# Patient Record
Sex: Female | Born: 1985 | Race: Black or African American | Hispanic: No | Marital: Single | Smoking: Never smoker
Health system: Southern US, Community
[De-identification: ages and names within clinical notes are randomized; demographics above are authoritative.]

## PROBLEM LIST (undated history)

## (undated) DIAGNOSIS — G43909 Migraine, unspecified, not intractable, without status migrainosus: Secondary | ICD-10-CM

## (undated) DIAGNOSIS — I10 Essential (primary) hypertension: Secondary | ICD-10-CM

---

## 2010-08-26 ENCOUNTER — Emergency Department (HOSPITAL_COMMUNITY)
Admission: EM | Admit: 2010-08-26 | Discharge: 2010-08-26 | Payer: Self-pay | Source: Home / Self Care | Admitting: Emergency Medicine

## 2010-11-26 LAB — STREP A DNA PROBE: Group A Strep Probe: NEGATIVE

## 2010-11-26 LAB — RAPID STREP SCREEN (MED CTR MEBANE ONLY): Streptococcus, Group A Screen (Direct): NEGATIVE

## 2012-04-30 ENCOUNTER — Encounter (HOSPITAL_COMMUNITY): Payer: Self-pay | Admitting: Emergency Medicine

## 2012-04-30 ENCOUNTER — Emergency Department (HOSPITAL_COMMUNITY)
Admission: EM | Admit: 2012-04-30 | Discharge: 2012-04-30 | Disposition: A | Discharge status: Home / Self Care | Payer: PRIVATE HEALTH INSURANCE | Attending: Emergency Medicine | Admitting: Emergency Medicine

## 2012-04-30 DIAGNOSIS — K089 Disorder of teeth and supporting structures, unspecified: Secondary | ICD-10-CM | POA: Insufficient documentation

## 2012-04-30 DIAGNOSIS — K0889 Other specified disorders of teeth and supporting structures: Secondary | ICD-10-CM

## 2012-04-30 DIAGNOSIS — H9201 Otalgia, right ear: Secondary | ICD-10-CM

## 2012-04-30 DIAGNOSIS — H9209 Otalgia, unspecified ear: Secondary | ICD-10-CM | POA: Insufficient documentation

## 2012-04-30 DIAGNOSIS — R51 Headache: Secondary | ICD-10-CM | POA: Insufficient documentation

## 2012-04-30 MED ORDER — OXYCODONE-ACETAMINOPHEN 5-325 MG PO TABS
1.0000 | ORAL_TABLET | Freq: Once | ORAL | Status: AC
  Administered 2012-04-30: 1 via ORAL
  Filled 2012-04-30: qty 1

## 2012-04-30 MED ORDER — OXYCODONE-ACETAMINOPHEN 5-325 MG PO TABS: ORAL_TABLET | ORAL | Status: DC

## 2012-04-30 MED ORDER — IBUPROFEN 800 MG PO TABS
800.0000 mg | ORAL_TABLET | Freq: Once | ORAL | Status: AC
  Administered 2012-04-30: 800 mg via ORAL
  Filled 2012-04-30: qty 1

## 2012-04-30 MED ORDER — PENICILLIN V POTASSIUM 250 MG PO TABS
500.0000 mg | ORAL_TABLET | Freq: Once | ORAL | Status: AC
  Administered 2012-04-30: 500 mg via ORAL
  Filled 2012-04-30: qty 2

## 2012-04-30 MED ORDER — PENICILLIN V POTASSIUM 500 MG PO TABS: 500.0000 mg | ORAL_TABLET | Freq: Four times a day (QID) | ORAL | Status: AC

## 2012-04-30 NOTE — ED Notes (Signed)
Pt c/o rt ear, jaw, and head pain since Saturday.

## 2012-04-30 NOTE — ED Notes (Signed)
R. Miller, PA at bedside  

## 2012-04-30 NOTE — ED Provider Notes (Signed)
History     CSN: [id number]  Arrival date & time 04/30/12  1729   First MD Initiated Contact with Patient 04/30/12 1749      Chief Complaint  Patient presents with  . Otalgia  . Headache  . Dental Pain    (Consider location/radiation/quality/duration/timing/severity/associated sxs/prior treatment) HPI Comments: First noted pain in R ear.  Then she noted pain in R upper 1st molar which has persisted.  She now has a generalized headache to R face and scalp.  No fever or chills.  Patient is a 26 y.o. female presenting with ear pain, headaches, and tooth pain. The history is provided by the patient. No language interpreter was used.  Otalgia This is a new problem. Episode onset: several days ago. There is pain in the right ear. The problem occurs constantly. The problem has been gradually worsening. There has been no fever. The pain is severe. Associated symptoms include headaches. Her past medical history does not include chronic ear infection, hearing loss or tympanostomy tube.  Headache  Pertinent negatives include no fever.  Dental PainThe primary symptoms include headaches. Primary symptoms do not include fever.  Additional symptoms include: ear pain.    History reviewed. No pertinent past medical history.  History reviewed. No pertinent past surgical history.  History reviewed. No pertinent family history.  History  Substance Use Topics  . Smoking status: Not on file  . Smokeless tobacco: Not on file  . Alcohol Use: No    OB History    Grav Para Term Preterm Abortions TAB SAB Ect Mult Living                  Review of Systems  Constitutional: Negative for fever and chills.  HENT: Positive for ear pain and dental problem.   Neurological: Positive for headaches.  All other systems reviewed and are negative.    Allergies  Review of patient's allergies indicates no known allergies.  Home Medications   Current Outpatient Rx  Name Route Sig Dispense Refill  .  HYDROCODONE-ACETAMINOPHEN 5-325 MG PO TABS Oral Take 2 tablets by mouth once as needed. For pain    . OXYCODONE-ACETAMINOPHEN 5-325 MG PO TABS  Take one tab po q 4-6 hrs prn pain 20 tablet 0  . PENICILLIN V POTASSIUM 500 MG PO TABS Oral Take 1 tablet (500 mg total) by mouth 4 (four) times daily. 40 tablet 0    BP 164/101  Pulse 63  Temp 98.4 F (36.9 C) (Oral)  Resp 18  Ht 5\' 1"  (1.549 m)  Wt 295 lb (133.811 kg)  BMI 55.74 kg/m2  SpO2 100%  LMP 04/27/2012  Physical Exam  Nursing note and vitals reviewed. Constitutional: She is oriented to person, place, and time. She appears well-developed and well-nourished. No distress.  HENT:  Head: Normocephalic and atraumatic. No trismus in the jaw.  Right Ear: Hearing, tympanic membrane, external ear and ear canal normal. No drainage. Tympanic membrane is not erythematous, not retracted and not bulging. No middle ear effusion. No decreased hearing is noted.  Left Ear: External ear normal.  Mouth/Throat: Uvula is midline and mucous membranes are normal. She does not have dentures. No oral lesions. Normal dentition. No dental abscesses, uvula swelling, lacerations or dental caries.    Eyes: EOM are normal.  Neck: Normal range of motion.  Cardiovascular: Normal rate, regular rhythm and normal heart sounds.   Pulmonary/Chest: Effort normal and breath sounds normal.  Abdominal: Soft. She exhibits no distension. There is no  tenderness.  Musculoskeletal: Normal range of motion.  Neurological: She is alert and oriented to person, place, and time.  Skin: Skin is warm and dry.  Psychiatric: She has a normal mood and affect. Judgment normal.    ED Course  Procedures (including critical care time)  Labs Reviewed - No data to display No results found.   1. Pain, dental   2. Earache, right   3. Headache       MDM  rx-pen VK 500 mg, 40 rx-percocet, 20 OTC ibuprofen F/u with dentist of choice.        Evalina Field, [place] 04/30/12  [phone]

## 2012-05-01 NOTE — ED Provider Notes (Signed)
Medical screening examination/treatment/procedure(s) were performed by non-physician practitioner and as supervising physician I was immediately available for consultation/collaboration.   Carleene Cooper III, MD 05/01/12 1250

## 2012-08-24 ENCOUNTER — Emergency Department (HOSPITAL_COMMUNITY): Payer: PRIVATE HEALTH INSURANCE

## 2012-08-24 ENCOUNTER — Encounter (HOSPITAL_COMMUNITY): Payer: Self-pay

## 2012-08-24 ENCOUNTER — Emergency Department (HOSPITAL_COMMUNITY)
Admission: EM | Admit: 2012-08-24 | Discharge: 2012-08-24 | Disposition: A | Discharge status: Home / Self Care | Payer: PRIVATE HEALTH INSURANCE | Attending: Emergency Medicine | Admitting: Emergency Medicine

## 2012-08-24 DIAGNOSIS — IMO0001 Reserved for inherently not codable concepts without codable children: Secondary | ICD-10-CM | POA: Insufficient documentation

## 2012-08-24 DIAGNOSIS — J40 Bronchitis, not specified as acute or chronic: Secondary | ICD-10-CM | POA: Insufficient documentation

## 2012-08-24 DIAGNOSIS — J9801 Acute bronchospasm: Secondary | ICD-10-CM | POA: Insufficient documentation

## 2012-08-24 DIAGNOSIS — J029 Acute pharyngitis, unspecified: Secondary | ICD-10-CM | POA: Insufficient documentation

## 2012-08-24 DIAGNOSIS — R6883 Chills (without fever): Secondary | ICD-10-CM | POA: Insufficient documentation

## 2012-08-24 DIAGNOSIS — R197 Diarrhea, unspecified: Secondary | ICD-10-CM | POA: Insufficient documentation

## 2012-08-24 DIAGNOSIS — J3489 Other specified disorders of nose and nasal sinuses: Secondary | ICD-10-CM | POA: Insufficient documentation

## 2012-08-24 DIAGNOSIS — J209 Acute bronchitis, unspecified: Secondary | ICD-10-CM

## 2012-08-24 LAB — RAPID STREP SCREEN (MED CTR MEBANE ONLY): Streptococcus, Group A Screen (Direct): NEGATIVE

## 2012-08-24 MED ORDER — HYDROCOD POLST-CHLORPHEN POLST 10-8 MG/5ML PO LQCR: 5.0000 mL | Freq: Two times a day (BID) | ORAL | Status: DC | PRN

## 2012-08-24 MED ORDER — ALBUTEROL SULFATE (5 MG/ML) 0.5% IN NEBU
5.0000 mg | INHALATION_SOLUTION | Freq: Once | RESPIRATORY_TRACT | Status: AC
  Administered 2012-08-24: 5 mg via RESPIRATORY_TRACT
  Filled 2012-08-24: qty 1

## 2012-08-24 MED ORDER — IPRATROPIUM BROMIDE 0.02 % IN SOLN
0.5000 mg | Freq: Once | RESPIRATORY_TRACT | Status: AC
  Administered 2012-08-24: 0.5 mg via RESPIRATORY_TRACT
  Filled 2012-08-24: qty 2.5

## 2012-08-24 MED ORDER — IBUPROFEN 800 MG PO TABS
800.0000 mg | ORAL_TABLET | Freq: Once | ORAL | Status: AC
  Administered 2012-08-24: 800 mg via ORAL
  Filled 2012-08-24: qty 1

## 2012-08-24 MED ORDER — HYDROCOD POLST-CHLORPHEN POLST 10-8 MG/5ML PO LQCR
5.0000 mL | Freq: Once | ORAL | Status: AC
  Administered 2012-08-24: 5 mL via ORAL
  Filled 2012-08-24: qty 5

## 2012-08-24 MED ORDER — ALBUTEROL SULFATE HFA 108 (90 BASE) MCG/ACT IN AERS
2.0000 | INHALATION_SPRAY | Freq: Once | RESPIRATORY_TRACT | Status: AC
  Administered 2012-08-24: 2 via RESPIRATORY_TRACT
  Filled 2012-08-24: qty 6.7

## 2012-08-24 NOTE — ED Notes (Signed)
Pt reports cough that started Sunday night, now has sore throat, diarrhea and congestion. Works in [employer] and has been exposed to sick children

## 2012-08-24 NOTE — ED Notes (Signed)
Resp therapy in to give inst on use  Of inhaler.

## 2012-08-24 NOTE — ED Notes (Signed)
Paged respiratory therapy for treatment.

## 2012-08-24 NOTE — ED Notes (Signed)
Pt c/o cough and sore throat since Sunday night.  Reports has had fever.  Temp 100.2 this morning.  PT took tylenol around 0700.

## 2012-08-26 NOTE — ED Provider Notes (Signed)
History     CSN: [id number]  Arrival date & time 08/24/12  0900   First MD Initiated Contact with Patient 08/24/12 [phone]      Chief Complaint  Patient presents with  . Cough  . Sore Throat  . Nasal Congestion  . Diarrhea    (Consider location/radiation/quality/duration/timing/severity/associated sxs/prior treatment) HPI Comments: Stacey Griffin presents with a 2 day history of flu like symptoms including non productive cough, sore throat,  Nasal congestion,  Chills and myalgias and also now has had several episodes of non bloody loose stools today. 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  She has taken tylenol for achiness and low grade fever this am,  With some improvement.  She denies chest pain, shortness of breath, nausea , vomiting and abdominal pain.  The history is provided by the patient.    History reviewed. No pertinent past medical history.  History reviewed. No pertinent past surgical history.  No family history on file.  History  Substance Use Topics  . Smoking status: Not on file  . Smokeless tobacco: Not on file  . Alcohol Use: No    OB History    Grav Para Term Preterm Abortions TAB SAB Ect Mult Living                  Review of Systems  Constitutional: Positive for chills. Negative for fever.  HENT: Positive for congestion and sore throat. Negative for neck pain.   Eyes: Negative.   Respiratory: Positive for cough. Negative for chest tightness and shortness of breath.   Cardiovascular: Negative for chest pain.  Gastrointestinal: Positive for diarrhea. Negative for nausea, vomiting, abdominal pain and abdominal distention.  Genitourinary: Negative.   Musculoskeletal: Positive for myalgias. Negative for joint swelling and arthralgias.  Skin: Negative.  Negative for rash and wound.  Neurological: Negative for dizziness, weakness, light-headedness, numbness and headaches.   Hematological: Negative.   Psychiatric/Behavioral: Negative.     Allergies  Review of patient's allergies indicates no known allergies.  Home Medications   Current Outpatient Rx  Name  Route  Sig  Dispense  Refill  . HYDROCOD POLST-CPM POLST ER 10-8 MG/5ML PO LQCR   Oral   Take 5 mLs by mouth every 12 (twelve) hours as needed.   80 mL   0     BP 132/84  Pulse 92  Temp 98.8 F (37.1 C) (Oral)  Resp 22  Ht 5\' 2"  (1.575 m)  Wt 290 lb (131.543 kg)  BMI 53.04 kg/m2  SpO2 100%  LMP 08/19/2012  Physical Exam  Nursing note and vitals reviewed. Constitutional: She appears well-developed and well-nourished.  HENT:  Head: Normocephalic and atraumatic.  Mouth/Throat: Posterior oropharyngeal erythema present. No oropharyngeal exudate, posterior oropharyngeal edema or tonsillar abscesses.  Eyes: Conjunctivae normal are normal.  Neck: Normal range of motion.  Cardiovascular: Normal rate, regular rhythm, normal heart sounds and intact distal pulses.   Pulmonary/Chest: Effort normal. No respiratory distress. She has wheezes.       Diffuse faint expiratory wheeze with fair air movement.    Abdominal: Soft. Bowel sounds are normal. There is no tenderness.  Musculoskeletal: Normal range of motion.  Neurological: She is alert.  Skin: Skin is warm and dry.  Psychiatric: She has a normal mood and affect.    ED Course  Procedures (including critical care time)   Labs Reviewed  RAPID STREP SCREEN  LAB REPORT - SCANNED  No results found.   1. Bronchitis with bronchospasm     Pt given albuterol 5/atrovent 0.5 mg neb tx with complete resolution of wheezing, normal air flow at recheck.    MDM  Patients labs and/or radiological studies were reviewed during the medical decision making and disposition process. Pt responded quickly to alb/atrovent neb given.  She was given an albuterol mdi for home use - 2 puffs q 4 prn.  Encouraged rest,  Increased fluids,  tussionex prescribed  for cough and myalgias.  Recheck prn worsened sx.  The patient appears reasonably screened and/or stabilized for discharge and I doubt any other medical condition or other [facility] requiring further screening, evaluation, or treatment in the ED at this time prior to discharge.         Burgess Amor, [place] 08/26/12 [phone]

## 2012-08-27 NOTE — ED Provider Notes (Signed)
Medical screening examination/treatment/procedure(s) were performed by non-physician practitioner and as supervising physician I was immediately available for consultation/collaboration.  Jones Skene, M.D.     Jones Skene, MD 08/27/12 2101

## 2014-09-24 ENCOUNTER — Encounter (HOSPITAL_COMMUNITY): Payer: Self-pay | Admitting: Emergency Medicine

## 2014-09-24 ENCOUNTER — Emergency Department (HOSPITAL_COMMUNITY)
Admission: EM | Admit: 2014-09-24 | Discharge: 2014-09-24 | Disposition: A | Discharge status: Home / Self Care | Payer: PRIVATE HEALTH INSURANCE | Attending: Emergency Medicine | Admitting: Emergency Medicine

## 2014-09-24 DIAGNOSIS — J029 Acute pharyngitis, unspecified: Secondary | ICD-10-CM | POA: Insufficient documentation

## 2014-09-24 DIAGNOSIS — Z7952 Long term (current) use of systemic steroids: Secondary | ICD-10-CM | POA: Insufficient documentation

## 2014-09-24 DIAGNOSIS — Z792 Long term (current) use of antibiotics: Secondary | ICD-10-CM | POA: Insufficient documentation

## 2014-09-24 DIAGNOSIS — H6501 Acute serous otitis media, right ear: Secondary | ICD-10-CM | POA: Insufficient documentation

## 2014-09-24 MED ORDER — AMOXICILLIN 500 MG PO CAPS: 500.0000 mg | ORAL_CAPSULE | Freq: Three times a day (TID) | ORAL | Status: AC

## 2014-09-24 MED ORDER — PREDNISONE 50 MG PO TABS
60.0000 mg | ORAL_TABLET | Freq: Once | ORAL | Status: AC
  Administered 2014-09-24: 60 mg via ORAL
  Filled 2014-09-24 (×2): qty 1

## 2014-09-24 MED ORDER — PREDNISONE 10 MG PO TABS: ORAL_TABLET | ORAL | Status: DC

## 2014-09-24 MED ORDER — AMOXICILLIN 250 MG PO CAPS
500.0000 mg | ORAL_CAPSULE | Freq: Once | ORAL | Status: AC
Start: 2014-09-24 — End: 2014-09-24
  Administered 2014-09-24: 500 mg via ORAL
  Filled 2014-09-24: qty 2

## 2014-09-24 NOTE — ED Provider Notes (Signed)
CSN: [id number]     Arrival date & time 09/24/14  7829 History  This chart was scribed for non-physician practitioner Vilinda Blanks, PA-C working with Donnetta Hutching, MD by Murriel Hopper, ED Scribe. This patient was seen in room APFT22/APFT22 and the patient's care was started at 7:36 PM.    Chief Complaint  Patient presents with  . Sore Throat  . Otalgia   The history is provided by the patient. No language interpreter was used.     HPI Comments: Stacey Griffin is a 29 y.o. female who presents to the Emergency Department complaining of constant right ear otalgia with associated sore throat thick nonbloody nasal congestion, and a headache that has been present for three weeks. Pt states that she began having a sore throat two nights ago. Pt states that she has had a headache for the past two weeks. Pt also notes that she has trouble swallowing, as it is painful to swallow in the left side of her throat. Pt states that she has taken Tylenol and Motrin for her symptoms with little relief. Pt denies fever, ear drainage, dizziness, decreased hearing acuity, nausea, vomiting, vertigo and neck stiffness.   History reviewed. No pertinent past medical history. History reviewed. No pertinent past surgical history. History reviewed. No pertinent family history. History  Substance Use Topics  . Smoking status: Never Smoker   . Smokeless tobacco: Not on file  . Alcohol Use: No   OB History    No data available     Review of Systems  Constitutional: Negative for fever.  HENT: Positive for congestion, ear pain, rhinorrhea, sore throat and trouble swallowing. Negative for ear discharge and facial swelling.   Respiratory: Negative for cough.   Neurological: Positive for headaches.      Allergies  Review of patient's allergies indicates no known allergies.  Home Medications   Prior to Admission medications   Medication Sig Start Date End Date Taking? Authorizing Provider  amoxicillin (AMOXIL)  500 MG capsule Take 1 capsule (500 mg total) by mouth 3 (three) times daily. 09/24/14 10/04/14  Burgess Amor, PA-C  chlorpheniramine-HYDROcodone (TUSSIONEX) 10-8 MG/5ML LQCR Take 5 mLs by mouth every 12 (twelve) hours as needed. Patient not taking: Reported on 09/24/2014 08/24/12   Burgess Amor, PA-C  predniSONE (DELTASONE) 10 MG tablet 6, 5, 4, 3, 2 then 1 tablet by mouth daily for 6 days total. 09/24/14   Burgess Amor, PA-C   BP 157/90 mmHg  Pulse 91  Temp(Src) 98.3 F (36.8 C) (Oral)  Resp 20  Ht 5' 1.5" (1.562 m)  Wt 343 lb 9.6 oz (155.856 kg)  BMI 63.88 kg/m2  SpO2 100% Physical Exam  Constitutional: She is oriented to person, place, and time. She appears well-developed and well-nourished.  HENT:  Head: Normocephalic and atraumatic.  Right Ear: Ear canal normal. Tympanic membrane is retracted. A middle ear effusion is present.  Left Ear: Tympanic membrane and ear canal normal.  Nose: Mucosal edema and rhinorrhea present.  Mouth/Throat: Uvula is midline and mucous membranes are normal. Posterior oropharyngeal edema present. No oropharyngeal exudate, posterior oropharyngeal erythema or tonsillar abscesses.  Left TM: slightly retracted, clear fluid level External canal is clear Bilateral symmetric hypertrophied tonsils  Without erythema or exudate Uvula midline   Eyes: Conjunctivae are normal.  Cardiovascular: Normal rate, regular rhythm and normal heart sounds.   Pulmonary/Chest: Effort normal and breath sounds normal. No respiratory distress. She has no wheezes. She has no rales.  Abdominal: Soft. There is no tenderness.  Musculoskeletal: Normal range of motion.  Lymphadenopathy:    She has no cervical adenopathy.  Neurological: She is alert and oriented to person, place, and time.  Skin: Skin is warm and dry. No rash noted.  Psychiatric: She has a normal mood and affect.    ED Course  Procedures (including critical care time)  DIAGNOSTIC STUDIES: Oxygen Saturation is 100% on RA,  normal by my interpretation.    COORDINATION OF CARE: 7:48 PM Discussed treatment plan with pt at bedside and pt agreed to plan.   Labs Review Labs Reviewed - No data to display  Imaging Review No results found.   EKG Interpretation None      MDM   Final diagnoses:  Right acute serous otitis media, recurrence not specified    Pt placed on amoxil and prednisone. Advised coricidin for decongestant, vicks, menthol lozenges.  Discussed elevated bp and need for close f/u.    The patient appears reasonably screened and/or stabilized for discharge and I doubt any other medical condition or other [facility] requiring further screening, evaluation, or treatment in the ED at this time prior to discharge.   *I personally performed the services described in this documentation, which was scribed in my presence. The recorded information has been reviewed and is accurate.   Burgess AmorJulie Delwyn Scoggin, PA-C 09/25/14 1240  Donnetta HutchingBrian Cook, MD 09/26/14 1723

## 2014-09-24 NOTE — ED Notes (Signed)
Patient states sore throat since last night. Right ear pain x 2 days.

## 2014-09-24 NOTE — Discharge Instructions (Signed)
Otitis Media With Effusion Otitis media with effusion is the presence of fluid in the middle ear. This is a common problem in children, which often follows ear infections. It may be present for weeks or longer after the infection. Unlike an acute ear infection, otitis media with effusion refers only to fluid behind the ear drum and not infection. Children with repeated ear and sinus infections and allergy problems are the most likely to get otitis media with effusion. CAUSES  The most frequent cause of the fluid buildup is dysfunction of the eustachian tubes. These are the tubes that drain fluid in the ears to the back of the nose (nasopharynx). SYMPTOMS   The main symptom of this condition is hearing loss. As a result, you or your child may:  Listen to the TV at a loud volume.  Not respond to questions.  Ask "what" often when spoken to.  Mistake or confuse one sound or word for another.  There may be a sensation of fullness or pressure but usually not pain. DIAGNOSIS   Your health care provider will diagnose this condition by examining you or your child's ears.  Your health care provider may test the pressure in you or your child's ear with a tympanometer.  A hearing test may be conducted if the problem persists. TREATMENT   Treatment depends on the duration and the effects of the effusion.  Antibiotics, decongestants, nose drops, and cortisone-type drugs (tablets or nasal spray) may not be helpful.  Children with persistent ear effusions may have delayed language or behavioral problems. Children at risk for developmental delays in hearing, learning, and speech may require referral to a specialist earlier than children not at risk.  You or your child's health care provider may suggest a referral to an ear, nose, and throat surgeon for treatment. The following may help restore normal hearing:  Drainage of fluid.  Placement of ear tubes (tympanostomy tubes).  Removal of adenoids  (adenoidectomy). HOME CARE INSTRUCTIONS   Avoid secondhand smoke.  Infants who are breastfed are less likely to have this condition.  Avoid feeding infants while they are lying flat.  Avoid known environmental allergens.  Avoid people who are sick. SEEK MEDICAL CARE IF:   Hearing is not better in 3 months.  Hearing is worse.  Ear pain.  Drainage from the ear.  Dizziness. MAKE SURE YOU:   Understand these instructions.  Will watch your condition.  Will get help right away if you are not doing well or get worse. Document Released: 10/09/2004 Document Revised: 01/16/2014 Document Reviewed: 03/29/2013 [facility] Patient Information 2015 [employer], [place]. This information is not intended to replace advice given to you by your health care provider. Make sure you discuss any questions you have with your health care provider.   Take your next dose of the prednisone tomorrow evening and your next dose of the antibiotic tomorrow morning.  As discussed, start taking coricidin brand for treatment of congestion.  This will not interfere with elevation in blood pressure.  You may also benefit from menthol lozenges (cough drops) .  A Vicks inhaler stick can also help relieve pressure and congestion.

## 2014-11-10 ENCOUNTER — Emergency Department (HOSPITAL_COMMUNITY)
Admission: EM | Admit: 2014-11-10 | Discharge: 2014-11-10 | Disposition: A | Discharge status: Home / Self Care | Payer: Self-pay | Attending: Emergency Medicine | Admitting: Emergency Medicine

## 2014-11-10 ENCOUNTER — Encounter (HOSPITAL_COMMUNITY): Payer: Self-pay | Admitting: *Deleted

## 2014-11-10 DIAGNOSIS — IMO0001 Reserved for inherently not codable concepts without codable children: Secondary | ICD-10-CM

## 2014-11-10 DIAGNOSIS — Z792 Long term (current) use of antibiotics: Secondary | ICD-10-CM | POA: Insufficient documentation

## 2014-11-10 DIAGNOSIS — R030 Elevated blood-pressure reading, without diagnosis of hypertension: Secondary | ICD-10-CM | POA: Insufficient documentation

## 2014-11-10 DIAGNOSIS — J029 Acute pharyngitis, unspecified: Secondary | ICD-10-CM

## 2014-11-10 DIAGNOSIS — Z79899 Other long term (current) drug therapy: Secondary | ICD-10-CM | POA: Insufficient documentation

## 2014-11-10 DIAGNOSIS — M791 Myalgia: Secondary | ICD-10-CM | POA: Insufficient documentation

## 2014-11-10 DIAGNOSIS — Z20818 Contact with and (suspected) exposure to other bacterial communicable diseases: Secondary | ICD-10-CM

## 2014-11-10 MED ORDER — AMOXICILLIN 500 MG PO CAPS: 500.0000 mg | ORAL_CAPSULE | Freq: Three times a day (TID) | ORAL | Status: AC

## 2014-11-10 MED ORDER — FLUCONAZOLE 150 MG PO TABS: 150.0000 mg | ORAL_TABLET | Freq: Every day | ORAL | Status: AC

## 2014-11-10 NOTE — Discharge Instructions (Signed)
Strep Throat °Strep throat is an infection of the throat caused by a bacteria named Streptococcus pyogenes. Your health care provider may call the infection streptococcal "tonsillitis" or "pharyngitis" depending on whether there are signs of inflammation in the tonsils or back of the throat. Strep throat is most common in children aged 29-15 years during the cold months of the year, but it can occur in people of any age during any season. This infection is spread from person to person (contagious) through coughing, sneezing, or other close contact. °SIGNS AND SYMPTOMS  °· Fever or chills. °· Painful, swollen, red tonsils or throat. °· Pain or difficulty when swallowing. °· White or yellow spots on the tonsils or throat. °· Swollen, tender lymph nodes or "glands" of the neck or under the jaw. °· Red rash all over the body (rare). °DIAGNOSIS  °Many different infections can cause the same symptoms. A test must be done to confirm the diagnosis so the right treatment can be given. A "rapid strep test" can help your health care provider make the diagnosis in a few minutes. If this test is not available, a light swab of the infected area can be used for a throat culture test. If a throat culture test is done, results are usually available in a day or two. °TREATMENT  °Strep throat is treated with antibiotic medicine. °HOME CARE INSTRUCTIONS  °· Gargle with 1 tsp of salt in 1 cup of warm water, 3-4 times per day or as needed for comfort. °· Family members who also have a sore throat or fever should be tested for strep throat and treated with antibiotics if they have the strep infection. °· Make sure everyone in your household washes their hands well. °· Do not share food, drinking cups, or personal items that could cause the infection to spread to others. °· You may need to eat a soft food diet until your sore throat gets better. °· Drink enough water and fluids to keep your urine clear or pale yellow. This will help prevent  dehydration. °· Get plenty of rest. °· Stay home from school, day care, or work until you have been on antibiotics for 24 hours. °· Take medicines only as directed by your health care provider. °· Take your antibiotic medicine as directed by your health care provider. Finish it even if you start to feel better. °SEEK MEDICAL CARE IF:  °· The glands in your neck continue to enlarge. °· You develop a rash, cough, or earache. °· You cough up green, yellow-brown, or bloody sputum. °· You have pain or discomfort not controlled by medicines. °· Your problems seem to be getting worse rather than better. °· You have a fever. °SEEK IMMEDIATE MEDICAL CARE IF:  °· You develop any new symptoms such as vomiting, severe headache, stiff or painful neck, chest pain, shortness of breath, or trouble swallowing. °· You develop severe throat pain, drooling, or changes in your voice. °· You develop swelling of the neck, or the skin on the neck becomes red and tender. °· You develop signs of dehydration, such as fatigue, dry mouth, and decreased urination. °· You become increasingly sleepy, or you cannot wake up completely. °MAKE SURE YOU: °· Understand these instructions. °· Will watch your condition. °· Will get help right away if you are not doing well or get worse. °Document Released: 08/29/2000 Document Revised: 01/16/2014 Document Reviewed: 10/31/2010 °ExitCare® Patient Information ©2015 [employer]. This information is not intended to replace advice given to you by   your health care provider. Make sure you discuss any questions you have with your health care provider.   Hypertension Hypertension, commonly called high blood pressure, is when the force of blood pumping through your arteries is too strong. Your arteries are the blood vessels that carry blood from your heart throughout your body. A blood pressure reading consists of a higher number over a lower number, such as 110/72. The higher number (systolic) is the pressure  inside your arteries when your heart pumps. The lower number (diastolic) is the pressure inside your arteries when your heart relaxes. Ideally you want your blood pressure below 120/80. Hypertension forces your heart to work harder to pump blood. Your arteries may become narrow or stiff. Having hypertension puts you at risk for heart disease, stroke, and other problems.  RISK FACTORS Some risk factors for high blood pressure are controllable. Others are not.  Risk factors you cannot control include:   Race. You may be at higher risk if you are African American.  Age. Risk increases with age.  Gender. Men are at higher risk than women before age 29 years. After age 29, women are at higher risk than men. Risk factors you can control include:  Not getting enough exercise or physical activity.  Being overweight.  Getting too much fat, sugar, calories, or salt in your diet.  Drinking too much alcohol. SIGNS AND SYMPTOMS Hypertension does not usually cause signs or symptoms. Extremely high blood pressure (hypertensive crisis) may cause headache, anxiety, shortness of breath, and nosebleed. DIAGNOSIS  To check if you have hypertension, your health care provider will measure your blood pressure while you are seated, with your arm held at the level of your heart. It should be measured at least twice using the same arm. Certain conditions can cause a difference in blood pressure between your right and left arms. A blood pressure reading that is higher than normal on one occasion does not mean that you need treatment. If one blood pressure reading is high, ask your health care provider about having it checked again. TREATMENT  Treating high blood pressure includes making lifestyle changes and possibly taking medicine. Living a healthy lifestyle can help lower high blood pressure. You may need to change some of your habits. Lifestyle changes may include:  Following the DASH diet. This diet is high in  fruits, vegetables, and whole grains. It is low in salt, red meat, and added sugars.  Getting at least 2 hours of brisk physical activity every week.  Losing weight if necessary.  Not smoking.  Limiting alcoholic beverages.  Learning ways to reduce stress. If lifestyle changes are not enough to get your blood pressure under control, your health care provider may prescribe medicine. You may need to take more than one. Work closely with your health care provider to understand the risks and benefits. HOME CARE INSTRUCTIONS  Have your blood pressure rechecked as directed by your health care provider.   Take medicines only as directed by your health care provider. Follow the directions carefully. Blood pressure medicines must be taken as prescribed. The medicine does not work as well when you skip doses. Skipping doses also puts you at risk for problems.   Do not smoke.   Monitor your blood pressure at home as directed by your health care provider. SEEK MEDICAL CARE IF:   You think you are having a reaction to medicines taken.  You have recurrent headaches or feel dizzy.  You have swelling in your ankles.  You have trouble with your vision. SEEK IMMEDIATE MEDICAL CARE IF:  You develop a severe headache or confusion.  You have unusual weakness, numbness, or feel faint.  You have severe chest or abdominal pain.  You vomit repeatedly.  You have trouble breathing. MAKE SURE YOU:   Understand these instructions.  Will watch your condition.  Will get help right away if you are not doing well or get worse. Document Released: 09/01/2005 Document Revised: 01/16/2014 Document Reviewed: 06/24/2013 [facility] Patient Information 2015 [place], [place]. This information is not intended to replace advice given to you by your health care provider. Make sure you discuss any questions you have with your health care provider.    Emergency Department Resource Guide 1) Find a Doctor  and Pay Out of Pocket Although you won't have to find out who is covered by your insurance plan, it is a good idea to ask around and get recommendations. You will then need to call the office and see if the doctor you have chosen will accept you as a new patient and what types of options they offer for patients who are self-pay. Some doctors offer discounts or will set up payment plans for their patients who do not have insurance, but you will need to ask so you aren't surprised when you get to your appointment.  2) Contact Your Local Health Department Not all health departments have doctors that can see patients for sick visits, but many do, so it is worth a call to see if yours does. If you don't know where your local health department is, you can check in your phone book. The CDC also has a tool to help you locate your state's health department, and many state websites also have listings of all of their local health departments.  3) Find a Walk-in Clinic If your illness is not likely to be very severe or complicated, you may want to try a walk in clinic. These are popping up all over the country in pharmacies, drugstores, and shopping centers. They're usually staffed by nurse practitioners or physician assistants that have been trained to treat common illnesses and complaints. They're usually fairly quick and inexpensive. However, if you have serious medical issues or chronic medical problems, these are probably not your best option.  No Primary Care Doctor: - Call Health Connect at  [phone] - they can help you locate a primary care doctor that  accepts your insurance, provides certain services, etc. - Physician Referral Service- [phone]  Chronic Pain Problems: Organization         Address  Phone   Notes  [facility]  [phone] Patients need to be referred by their primary care doctor.   Medication Assistance: Organization         Address  Phone    Notes  [facility] Medication [facility] [address] [place] [place], [place] [address] [phone] --Must be a resident of [facility] -- Must have NO insurance coverage whatsoever (no Medicaid/ Medicare, etc.) -- The pt. MUST have a primary care doctor that directs their care regularly and follows them in the community   [facility]  [phone]   [employer]  [phone]    Agencies that provide inexpensive medical care: Organization         Address  Phone   Notes  [facility]  [phone]   [facility]    [phone]   [facility]  Outpatient Clinic [address] [place], [place] [address] [phone]   [facility] [address] [place] [phone]   Planned Parenthood    [phone]   [facility]    [phone]   [facility]  [address], [place] Phone:  [phone], Fax:  [phone] Hours of Operation:  9 am - 6 pm, M-F.  Also accepts Medicaid/Medicare and self-pay.  [facility]  [address], [place] Phone: [phone], Fax: [phone]. Hours of Operation:  8:30 am - 5:30 pm, M-F.  Also accepts Medicaid and self-pay.  [facility] [place] [address] [place] Phone: [phone]   [facility] [address] Natasha Bence [place], [place] [phone], Ext. [phone] Mondays & Thursdays: 7-9 AM.  First 15 patients are seen on a first come, first serve basis.    Medicaid-accepting [facility] Providers:  Organization         Address  Phone   Notes  [facility] [address], [place] [phone] Also accepts self-pay patients.  [facility] [address] Laurell Josephs [place], [place]  [phone]   [facility] [address], [place] [phone]   [facility] [address] [place] [phone]   Renaye Rakers [address], [place]   [phone] Only accepts [place] Access [place] patients after they have their name applied to their card.   Self-Pay (no insurance) in [facility]:  Organization         Address  Phone   Notes  Sickle Cell Patients, [facility] [address] [place], [place] [phone]   [facility] [address] [place], [place] [phone]   [facility] [place]  [address], [place] [phone]   [facility]/Dr. Osei-Bonsu  [address], [place] or [address], [place] [phone] Phone number for both [place] and [place] locations is the same.  [facility] [address] [place] [phone]   [facility] [address] [place] or [address] [phone]   [facility] [address] [place] [phone], phone; [phone], fax Sees patients 1st and 3rd Saturday of every month.  Must not qualify for public or private insurance (i.e. Medicaid, Medicare, [place] Health Choice, Veterans' Benefits)  Household income should be no more than 200% of the poverty level The clinic cannot treat you if you are pregnant or think you are pregnant  Sexually transmitted diseases are not treated at the clinic.    Dental Care: Organization         Address  Phone  Notes  [facility] Department of [facility] [address] [place], [place] [phone] Accepts children up to age 58 who are enrolled in [place] or [place] Health Choice; pregnant women with a Medicaid card; and children who have applied for Medicaid or [place] Health Choice, but were declined, whose parents can pay a reduced fee at time of service.  [facility] Department of [facility]  [address], [place] [phone] Accepts children up to age 61 who are enrolled  in Medicaid or [place] Health Choice; pregnant women with a Medicaid card; and children who have applied for Medicaid or  Health Choice, but were declined, whose parents can pay a reduced fee at time of service.  [facility]  [address] [place], [place] [phone] Patients are seen by appointment only. Walk-ins are not accepted. [facility] will see patients 29 years of age and older. Monday - Tuesday (8am-5pm) Most Wednesdays (8:30-5pm) $30 per visit, cash only  [facility]  [address], [facility] [phone] Patients are seen by appointment only. Walk-ins are not accepted. [facility] will see patients 53 years of age and older. One Wednesday Evening (Monthly: Volunteer Based).  $30 per visit, cash only  [employer]  [phone] for adults; Children under age 30, call Graduate Pediatric Dentistry at [phone]. Children aged 22-14, please call [phone] to request a pediatric application.  Dental services are provided in all areas of dental care including fillings, crowns and bridges, complete and partial dentures, implants, gum treatment, root canals, and extractions. Preventive care is also provided. Treatment is provided to both adults and children. Patients are selected via a lottery and there is often a waiting list.   [facility] [address] [place]  [phone] [email or web address]   [facility] [address] [place], [place] [phone], Ext. [phone] Second and Fourth Thursday of each month, opens at 6:30 AM; Clinic ends at 9 AM.  Patients are seen on a first-come first-served basis, and a limited number are seen during each clinic.   [facility]  [address] Ether Griffins [place], [place] [phone]   Eligibility Requirements You must have lived in  [place], [place], or [place] counties for at least the last three months.   You cannot be eligible for state or federal sponsored [place], including [employer], [place], or [employer].   You generally cannot be eligible for healthcare insurance through your employer.    How to apply: Eligibility screenings are held every Tuesday and Wednesday afternoon from 1:00 pm until 4:00 pm. You do not need an appointment for the interview!  [facility] [address] [place], [place] [phone]   [facility] Health Department  [phone]   [facility] Health Department  [phone]   [facility] Health Department  [phone]    Behavioral Health Resources in the Community: Intensive Outpatient Programs Organization         Address  Phone  Notes  [facility] Services [address] [place], [place] [phone]   [facility] Outpatient [address] [place], [place] [phone]   [facility] [address], [place], [place]  [phone]   [facility] [address]  [place], [place] [phone] or [phone]   Substance Abuse Resources Organization         Address  Phone  Notes  [facility]  [phone]   [facility]  [phone]   [facility]  [phone]   Floydene Flock  [phone]   Residential & Outpatient Substance Abuse Program  [phone]   Psychological Services Organization         Address  Phone  Notes  [facility]  [phone]   [facility] Services  [phone]   [facility] Mental Health [address], [place] [phone] or  [phone]    Mobile Crisis Teams Organization         Address  Phone  Notes  [facility], Mobile Crisis Care Unit  [phone]   [facility]  [address] [place], [place] [phone]   [facility] [place] [facility] [address] [place] [phone]    Self-Help/Support Groups Organization         Address  Phone             Notes  [facility] [place] - variety of support groups  336- I7437963 Call for more information  Narcotics Anonymous (NA), [facility] [address], [employer] [place]  2 meetings at this location   Statistician         Address  Phone  Notes  [facility] [address],    [place]  [phone]   [facility]  [address], [place] [address], [place], [place] [phone]   [facility] [address] [place], [place] [phone] Admissions: 8am-3pm M-F  [facility] [address],    [place], [place] [phone]   [facility] [address] [place], [place], [place] [phone]   [facility] [address],  [place], [place] [phone]   [facility] - Intensive Outpatient [address] Laurell Josephs [address], [place], [place] [phone]   [facility] (Addiction [facility] [address] [place].,  [place], [place] [phone] or [phone]   [facility] ([facility]) [address], [place], [place] [phone] Accepts Medicaid  [facility] [address] [place] [address]  [place] [phone] Substance Abuse/Addiction Treatment   [facility] Organization         Address  Phone  Notes  [facility]  [phone]   Angie Fava, PhD [address] Ervin Knack [place], [place]   [phone] or [phone]   [facility]   [address] [place] [address] [place], [place] [phone]   [facility] [address] [place], [place] [phone] Insurance/Medicaid/sponsorship through [facility] [address]                                    [place], [place] [phone] Therapy/tele-psych/case  [facility] [address], [place] [phone]    Dr. Lolly Mustache  [phone]   [facility]  [employer] [facility] Dept. 1) [address] [place] 2) [address], [place] 3)  [address], [place] [phone]  [phone]   [facility] Child Abuse Hotline 304-042-9059 or [phone] (After Hours)      Use the medicine as prescribed until gone.  Rest and make sure you are drinking plenty of fluids.  See the resource guide for locating a primary doctor.  Your blood pressure is elevated today and you should have this rechecked within the week.

## 2014-11-10 NOTE — ED Notes (Addendum)
Sore throat, fever, chills, body aches  Has been exposed to strep

## 2014-11-10 NOTE — ED Notes (Signed)
Pt seen and released from triage,Exam by PA

## 2014-11-11 NOTE — ED Provider Notes (Signed)
CSN: [id number]     Arrival date & time 11/10/14  1906 History   First MD Initiated Contact with Patient 11/10/14 2016     Chief Complaint  Patient presents with  . Sore Throat     (Consider location/radiation/quality/duration/timing/severity/associated sxs/prior Treatment) Patient is a 29 y.o. female presenting with pharyngitis. The history is provided by the patient.  Sore Throat This is a new (she reports being exposed to strep throat this week) problem. The current episode started yesterday. The problem occurs constantly. The problem has been gradually worsening. Associated symptoms include chills, fatigue, a fever, myalgias, a sore throat and swollen glands. Pertinent negatives include no abdominal pain, arthralgias, chest pain, congestion, coughing, nausea, neck pain, rash or vomiting. The symptoms are aggravated by swallowing. She has tried acetaminophen for the symptoms. The treatment provided no relief.    History reviewed. No pertinent past medical history. History reviewed. No pertinent past surgical history. History reviewed. No pertinent family history. History  Substance Use Topics  . Smoking status: Never Smoker   . Smokeless tobacco: Not on file  . Alcohol Use: No   OB History    No data available     Review of Systems  Constitutional: Positive for fever, chills and fatigue.  HENT: Positive for sore throat. Negative for congestion, rhinorrhea, sinus pressure, trouble swallowing and voice change.   Respiratory: Negative for cough and shortness of breath.   Cardiovascular: Negative for chest pain.  Gastrointestinal: Negative for nausea, vomiting and abdominal pain.  Musculoskeletal: Positive for myalgias. Negative for arthralgias and neck pain.  Skin: Negative for rash.      Allergies  Review of patient's allergies indicates no known allergies.  Home Medications   Prior to Admission medications   Medication Sig Start Date End Date Taking? Authorizing  Provider  amoxicillin (AMOXIL) 500 MG capsule Take 1 capsule (500 mg total) by mouth 3 (three) times daily. 11/10/14 11/20/14  Burgess AmorJulie Andrika Peraza, PA-C  chlorpheniramine-HYDROcodone (TUSSIONEX) 10-8 MG/5ML LQCR Take 5 mLs by mouth every 12 (twelve) hours as needed. Patient not taking: Reported on 09/24/2014 08/24/12   Burgess AmorJulie Darling Cieslewicz, PA-C  fluconazole (DIFLUCAN) 150 MG tablet Take 1 tablet (150 mg total) by mouth daily. 11/10/14 11/17/14  Burgess AmorJulie Beckey Polkowski, PA-C  predniSONE (DELTASONE) 10 MG tablet 6, 5, 4, 3, 2 then 1 tablet by mouth daily for 6 days total. 09/24/14   Burgess AmorJulie Meliyah Simon, PA-C   BP 146/89 mmHg  Pulse 92  Temp(Src) 98.5 F (36.9 C) (Oral)  Resp 20  Ht 5\' 2"  (1.575 m)  Wt 340 lb (154.223 kg)  BMI 62.17 kg/m2  SpO2 100% Physical Exam  Constitutional: She is oriented to person, place, and time. She appears well-developed and well-nourished.  HENT:  Head: Normocephalic and atraumatic.  Right Ear: Tympanic membrane and ear canal normal.  Left Ear: Tympanic membrane and ear canal normal.  Nose: No mucosal edema or rhinorrhea.  Mouth/Throat: Uvula is midline and mucous membranes are normal. Oropharyngeal exudate and posterior oropharyngeal erythema present. No posterior oropharyngeal edema or tonsillar abscesses.  Eyes: Conjunctivae are normal.  Cardiovascular: Normal rate and normal heart sounds.   Pulmonary/Chest: Effort normal. No respiratory distress. She has no wheezes. She has no rales.  Musculoskeletal: Normal range of motion.  Neurological: She is alert and oriented to person, place, and time.  Skin: Skin is warm and dry. No rash noted.  Psychiatric: She has a normal mood and affect.    ED Course  Procedures (including critical care time) Labs Review  Labs Reviewed - No data to display  Imaging Review No results found.   EKG Interpretation None      MDM   Final diagnoses:  Pharyngitis  Strep throat exposure  Elevated blood pressure    Pt with positive strep exposure and exam c/w  strep pharyngitis.  Amoxil, diflucan at patient request as abx gives yeast infections.  Advised increased fluid intake, rest, tylenol.  Advised recheck bp once feeling better given elevation tonight.  At repeat, it was improved but still elevated.    Burgess Amor, PA-C 11/11/14 1155  Donnetta Hutching, MD 11/11/14 1800

## 2016-01-22 ENCOUNTER — Emergency Department (HOSPITAL_COMMUNITY)
Admission: EM | Admit: 2016-01-22 | Discharge: 2016-01-22 | Disposition: A | Discharge status: Home / Self Care | Payer: Managed Care, Other (non HMO) | Attending: Emergency Medicine | Admitting: Emergency Medicine

## 2016-01-22 ENCOUNTER — Encounter (HOSPITAL_COMMUNITY): Payer: Self-pay | Admitting: Emergency Medicine

## 2016-01-22 DIAGNOSIS — B349 Viral infection, unspecified: Secondary | ICD-10-CM | POA: Diagnosis present

## 2016-01-22 DIAGNOSIS — R197 Diarrhea, unspecified: Secondary | ICD-10-CM | POA: Insufficient documentation

## 2016-01-22 HISTORY — DX: Migraine, unspecified, not intractable, without status migrainosus: G43.909

## 2016-01-22 LAB — URINALYSIS, ROUTINE W REFLEX MICROSCOPIC
BILIRUBIN URINE: NEGATIVE
Glucose, UA: NEGATIVE mg/dL
Ketones, ur: NEGATIVE mg/dL
Leukocytes, UA: NEGATIVE
NITRITE: NEGATIVE
PROTEIN: NEGATIVE mg/dL
SPECIFIC GRAVITY, URINE: 1.015 (ref 1.005–1.030)
pH: 6.5 (ref 5.0–8.0)

## 2016-01-22 LAB — URINE MICROSCOPIC-ADD ON

## 2016-01-22 LAB — POC URINE PREG, ED: Preg Test, Ur: NEGATIVE

## 2016-01-22 MED ORDER — IBUPROFEN 800 MG PO TABS
800.0000 mg | ORAL_TABLET | Freq: Once | ORAL | Status: AC
  Administered 2016-01-22: 800 mg via ORAL
  Filled 2016-01-22: qty 1

## 2016-01-22 MED ORDER — ACETAMINOPHEN 325 MG PO TABS
650.0000 mg | ORAL_TABLET | Freq: Once | ORAL | Status: AC | PRN
  Administered 2016-01-22: 650 mg via ORAL
  Filled 2016-01-22: qty 2

## 2016-01-22 MED ORDER — HYDROCODONE-ACETAMINOPHEN 5-325 MG PO TABS: 1.0000 | ORAL_TABLET | ORAL | Status: AC | PRN

## 2016-01-22 MED ORDER — ONDANSETRON 4 MG PO TBDP
4.0000 mg | ORAL_TABLET | Freq: Once | ORAL | Status: AC
Start: 2016-01-22 — End: 2016-01-22
  Administered 2016-01-22: 4 mg via ORAL
  Filled 2016-01-22: qty 1

## 2016-01-22 MED ORDER — HYDROCODONE-ACETAMINOPHEN 5-325 MG PO TABS
2.0000 | ORAL_TABLET | Freq: Once | ORAL | Status: AC
  Administered 2016-01-22: 2 via ORAL
  Filled 2016-01-22: qty 2

## 2016-01-22 MED ORDER — ONDANSETRON HCL 4 MG PO TABS: 4.0000 mg | ORAL_TABLET | Freq: Four times a day (QID) | ORAL | Status: AC

## 2016-01-22 NOTE — ED Notes (Addendum)
Patient complaining of headache and nausea since yesterday. States "I've had migraines in the past but it's been a long time." Patient also has temperature of 100.4 in triage. Protocol orders for tylenol to be given in triage.

## 2016-01-22 NOTE — Discharge Instructions (Signed)
Your examination favors a viral illness. Please use ibuprofen 600 mg every 6 hours as needed for aching or fever. Use Zofran every 6 hours for nausea, use Tylenol or ibuprofen for headache or mild pain, may use Norco for more severe pain. Norco may cause drowsiness, please do not drive, do not drink alcohol, do not operate machinery, or participate in activities requiring concentration while taking this medication. Viral Infections A virus is a type of germ. Viruses can cause:  Minor sore throats.  Aches and pains.  Headaches.  Runny nose.  Rashes.  Watery eyes.  Tiredness.  Coughs.  Loss of appetite.  Feeling sick to your stomach (nausea).  Throwing up (vomiting).  Watery poop (diarrhea). HOME CARE   Only take medicines as told by your doctor.  Drink enough water and fluids to keep your pee (urine) clear or pale yellow. Sports drinks are a good choice.  Get plenty of rest and eat healthy. Soups and broths with crackers or rice are fine. GET HELP RIGHT AWAY IF:   You have a very bad headache.  You have shortness of breath.  You have chest pain or neck pain.  You have an unusual rash.  You cannot stop throwing up.  You have watery poop that does not stop.  You cannot keep fluids down.  You or your child has a temperature by mouth above 102 F (38.9 C), not controlled by medicine.  Your baby is older than 3 months with a rectal temperature of 102 F (38.9 C) or higher.  Your baby is 43 months old or younger with a rectal temperature of 100.4 F (38 C) or higher. MAKE SURE YOU:   Understand these instructions.  Will watch this condition.  Will get help right away if you are not doing well or get worse.   This information is not intended to replace advice given to you by your health care provider. Make sure you discuss any questions you have with your health care provider.   Document Released: 08/14/2008 Document Revised: 11/24/2011 Document Reviewed:  02/07/2015 Elsevier Interactive Patient Education [employer]2016 [employer] Inc[employer]

## 2016-01-22 NOTE — ED Provider Notes (Signed)
CSN: [id number]     Arrival date & time 01/22/16  1857 History   First MD Initiated Contact with Patient 01/22/16 1924     Chief Complaint  Patient presents with  . Headache  . Nausea     (Consider location/radiation/quality/duration/timing/severity/associated sxs/prior Treatment) HPI Comments: Patient is a 30 year old female who presents to the emergency department with a complaint of nausea and headaches.  The patient states that on yesterday May 8 she began to have problems with nausea, this shortly accompanied by headache. She says that the headache was light at that time, but today the headache has become much more severe. She also has symptoms of body aches, nausea, low back pain, and diarrhea. 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  The history is provided by the patient.    Past Medical History  Diagnosis Date  . Migraines    History reviewed. No pertinent past surgical history. History reviewed. No pertinent family history. Social History  Substance Use Topics  . Smoking status: Never Smoker   . Smokeless tobacco: None  . Alcohol Use: No   OB History    No data available     Review of Systems  Constitutional: Positive for fever, chills, activity change and appetite change.  HENT: Positive for congestion. Negative for sore throat.   Gastrointestinal: Positive for nausea and diarrhea. Negative for vomiting.  Musculoskeletal: Positive for myalgias and back pain.  Skin: Negative for rash.  All other systems reviewed and are negative.     Allergies  Review of patient's allergies indicates no known allergies.  Home Medications   Prior to Admission medications   Medication Sig Start Date End Date Taking? Authorizing Provider  chlorpheniramine-HYDROcodone (TUSSIONEX) 10-8 MG/5ML LQCR Take 5 mLs by mouth every 12 (twelve) hours as needed. Patient not taking: Reported  on 09/24/2014 08/24/12   Burgess AmorJulie Idol, PA-C  predniSONE (DELTASONE) 10 MG tablet 6, 5, 4, 3, 2 then 1 tablet by mouth daily for 6 days total. 09/24/14   Burgess AmorJulie Idol, PA-C   BP 165/94 mmHg  Pulse 103  Temp(Src) 100.4 F (38 C) (Oral)  Resp 22  Ht 5\' 1"  (1.549 m)  Wt 154.223 kg  BMI 64.28 kg/m2  SpO2 100% Physical Exam  Constitutional: She is oriented to person, place, and time. She appears well-developed and well-nourished.  Non-toxic appearance.  HENT:  Head: Normocephalic.  Right Ear: Tympanic membrane and external ear normal.  Left Ear: Tympanic membrane and external ear normal.  There is some enlargement of the right tonsil. Mild increased redness of the posterior pharynx. The uvula is in the midline. Speech is clear and understandable.  Eyes: EOM and lids are normal. Pupils are equal, round, and reactive to light.  Neck: Normal range of motion. Neck supple. Carotid bruit is not present.  Cardiovascular: Normal rate, regular rhythm, normal heart sounds, intact distal pulses and normal pulses.   Pulmonary/Chest: Breath sounds normal. No respiratory distress.  Abdominal: Soft. Bowel sounds are normal. There is no tenderness. There is no guarding.  Musculoskeletal: Normal range of motion.  No hot joints noted.  Lymphadenopathy:       Head (right side): No submandibular adenopathy present.       Head (left side): No submandibular adenopathy present.    She has no cervical adenopathy.  Neurological: She is alert and oriented to person, place, and time. She has normal strength. No  cranial nerve deficit or sensory deficit.  Skin: Skin is warm and dry.  Psychiatric: She has a normal mood and affect. Her speech is normal.  Nursing note and vitals reviewed.   ED Course  Procedures (including critical care time) Labs Review Labs Reviewed  URINALYSIS, ROUTINE W REFLEX MICROSCOPIC (NOT AT [facility])  POC URINE PREG, ED    Imaging Review No results found. I have personally reviewed and  evaluated these images and lab results as part of my medical decision-making.   EKG Interpretation None      MDM  Temperature increased after oral Tylenol from 100.42 101.3. The patient was treated with ibuprofen 800 mg. The patient continues to have problems with headache. Patient treated with Norco.  Urine pregnancy test is negative.  Urinalysis is negative.  After the ibuprofen, the temperature came down to 98.7. The patient states her headache feels much better after the Norco. The patient will be discharged home. We discussed the need for good handwashing, using her mask until symptoms have resolved, we also is cussed using Tylenol or ibuprofen for fever and aching. Prescription for Norco and Zofran given to the patient. The patient will follow-up with her primary physician, or return to the emergency department if any changes or problems. Note excusing the patient from a work until Saturday, May 13 was given to the patient.    Final diagnoses:  None    *I have reviewed nursing notes, vital signs, and all appropriate lab and imaging results for this patient.**    Ivery Quale, PA-C 01/22/16 2130  Raeford Razor, MD 01/24/16 1308

## 2016-01-24 ENCOUNTER — Emergency Department (HOSPITAL_COMMUNITY)
Admission: EM | Admit: 2016-01-24 | Discharge: 2016-01-24 | Disposition: A | Discharge status: Home / Self Care | Payer: Managed Care, Other (non HMO) | Attending: Emergency Medicine | Admitting: Emergency Medicine

## 2016-01-24 ENCOUNTER — Emergency Department (HOSPITAL_COMMUNITY): Payer: Managed Care, Other (non HMO)

## 2016-01-24 ENCOUNTER — Encounter (HOSPITAL_COMMUNITY): Payer: Self-pay | Admitting: Emergency Medicine

## 2016-01-24 DIAGNOSIS — Z791 Long term (current) use of non-steroidal anti-inflammatories (NSAID): Secondary | ICD-10-CM | POA: Diagnosis not present

## 2016-01-24 DIAGNOSIS — M545 Low back pain: Secondary | ICD-10-CM | POA: Diagnosis present

## 2016-01-24 DIAGNOSIS — R51 Headache: Secondary | ICD-10-CM | POA: Diagnosis not present

## 2016-01-24 DIAGNOSIS — Z79899 Other long term (current) drug therapy: Secondary | ICD-10-CM | POA: Insufficient documentation

## 2016-01-24 DIAGNOSIS — R109 Unspecified abdominal pain: Secondary | ICD-10-CM | POA: Diagnosis not present

## 2016-01-24 DIAGNOSIS — R112 Nausea with vomiting, unspecified: Secondary | ICD-10-CM | POA: Diagnosis not present

## 2016-01-24 DIAGNOSIS — N12 Tubulo-interstitial nephritis, not specified as acute or chronic: Secondary | ICD-10-CM | POA: Diagnosis not present

## 2016-01-24 LAB — COMPREHENSIVE METABOLIC PANEL
ALK PHOS: 67 U/L (ref 38–126)
ALT: 13 U/L — ABNORMAL LOW (ref 14–54)
ANION GAP: 9 (ref 5–15)
AST: 15 U/L (ref 15–41)
Albumin: 3.2 g/dL — ABNORMAL LOW (ref 3.5–5.0)
BUN: 11 mg/dL (ref 6–20)
CHLORIDE: 100 mmol/L — AB (ref 101–111)
CO2: 27 mmol/L (ref 22–32)
Calcium: 8.8 mg/dL — ABNORMAL LOW (ref 8.9–10.3)
Creatinine, Ser: 1.04 mg/dL — ABNORMAL HIGH (ref 0.44–1.00)
GFR calc non Af Amer: >60 mL/min (ref >60)
Glucose, Bld: 103 mg/dL — ABNORMAL HIGH (ref 65–99)
POTASSIUM: 3.1 mmol/L — AB (ref 3.5–5.1)
SODIUM: 136 mmol/L (ref 135–145)
Total Bilirubin: 0.4 mg/dL (ref 0.3–1.2)
Total Protein: 7.7 g/dL (ref 6.5–8.1)

## 2016-01-24 LAB — CBC WITH DIFFERENTIAL/PLATELET
BASOS PCT: 0 %
Basophils Absolute: 0 10*3/uL (ref 0.0–0.1)
EOS ABS: 0 10*3/uL (ref 0.0–0.7)
EOS PCT: 0 %
HCT: 35.1 % — ABNORMAL LOW (ref 36.0–46.0)
HEMOGLOBIN: 11.1 g/dL — AB (ref 12.0–15.0)
Lymphocytes Relative: 11 %
Lymphs Abs: 1.3 10*3/uL (ref 0.7–4.0)
MCH: 23.6 pg — ABNORMAL LOW (ref 26.0–34.0)
MCHC: 31.6 g/dL (ref 30.0–36.0)
MCV: 74.7 fL — ABNORMAL LOW (ref 78.0–100.0)
MONOS PCT: 9 %
Monocytes Absolute: 1 10*3/uL (ref 0.1–1.0)
NEUTROS PCT: 81 %
Neutro Abs: 9.6 10*3/uL — ABNORMAL HIGH (ref 1.7–7.7)
PLATELETS: 286 10*3/uL (ref 150–400)
RBC: 4.7 MIL/uL (ref 3.87–5.11)
RDW: 14.2 % (ref 11.5–15.5)
WBC: 11.9 10*3/uL — ABNORMAL HIGH (ref 4.0–10.5)

## 2016-01-24 LAB — LIPASE, BLOOD: Lipase: 15 U/L (ref 11–51)

## 2016-01-24 LAB — URINALYSIS, ROUTINE W REFLEX MICROSCOPIC
Glucose, UA: NEGATIVE mg/dL
Ketones, ur: 15 mg/dL — AB
Leukocytes, UA: NEGATIVE
NITRITE: NEGATIVE
PH: 6 (ref 5.0–8.0)
Protein, ur: >300 mg/dL — AB
Specific Gravity, Urine: 1.025 (ref 1.005–1.030)

## 2016-01-24 LAB — URINE MICROSCOPIC-ADD ON

## 2016-01-24 MED ORDER — PROMETHAZINE HCL 25 MG PO TABS: 25.0000 mg | ORAL_TABLET | Freq: Four times a day (QID) | ORAL | Status: AC | PRN

## 2016-01-24 MED ORDER — FENTANYL CITRATE (PF) 100 MCG/2ML IJ SOLN
50.0000 ug | Freq: Once | INTRAMUSCULAR | Status: AC
  Administered 2016-01-24: 50 ug via INTRAVENOUS
  Filled 2016-01-24: qty 2

## 2016-01-24 MED ORDER — SODIUM CHLORIDE 0.9 % IV SOLN
INTRAVENOUS | Status: DC
  Administered 2016-01-24: 13:00:00 via INTRAVENOUS

## 2016-01-24 MED ORDER — IOPAMIDOL (ISOVUE-300) INJECTION 61%
150.0000 mL | Freq: Once | INTRAVENOUS | Status: AC | PRN
  Administered 2016-01-24: 125 mL via INTRAVENOUS

## 2016-01-24 MED ORDER — HYDROCODONE-ACETAMINOPHEN 5-325 MG PO TABS: 1.0000 | ORAL_TABLET | Freq: Four times a day (QID) | ORAL | Status: AC | PRN

## 2016-01-24 MED ORDER — SODIUM CHLORIDE 0.9 % IV BOLUS (SEPSIS)
1000.0000 mL | Freq: Once | INTRAVENOUS | Status: AC
  Administered 2016-01-24: 1000 mL via INTRAVENOUS

## 2016-01-24 MED ORDER — DIATRIZOATE MEGLUMINE & SODIUM 66-10 % PO SOLN
ORAL | Status: AC
  Administered 2016-01-24: 15 mL
  Filled 2016-01-24: qty 30

## 2016-01-24 MED ORDER — SODIUM CHLORIDE 0.9 % IV BOLUS (SEPSIS): 250.0000 mL | Freq: Once | INTRAVENOUS | Status: DC

## 2016-01-24 MED ORDER — SODIUM CHLORIDE 0.9 % IV SOLN
INTRAVENOUS | Status: DC
Start: 2016-01-25 — End: 2016-01-24

## 2016-01-24 MED ORDER — CEPHALEXIN 500 MG PO CAPS: 500.0000 mg | ORAL_CAPSULE | Freq: Four times a day (QID) | ORAL | Status: AC

## 2016-01-24 MED ORDER — ONDANSETRON HCL 4 MG/2ML IJ SOLN
4.0000 mg | Freq: Once | INTRAMUSCULAR | Status: AC
  Administered 2016-01-24: 4 mg via INTRAVENOUS
  Filled 2016-01-24: qty 2

## 2016-01-24 MED ORDER — DEXTROSE 5 % IV SOLN
1.0000 g | Freq: Once | INTRAVENOUS | Status: AC
  Administered 2016-01-24: 1 g via INTRAVENOUS
  Filled 2016-01-24: qty 10

## 2016-01-24 NOTE — Discharge Instructions (Signed)
Pyelonephritis, Adult Pyelonephritis is a kidney infection. The kidneys are organs that help clean your blood by moving waste out of your blood and into your pee (urine). This infection can happen quickly, or it can last for a long time. In most cases, it clears up with treatment and does not cause other problems. HOME CARE Medicines  Take over-the-counter and prescription medicines only as told by your doctor.  Take your antibiotic medicine as told by your doctor. Do not stop taking the medicine even if you start to feel better. General Instructions  Drink enough fluid to keep your pee clear or pale yellow.  Avoid caffeine, tea, and carbonated drinks.  Pee (urinate) often. Avoid holding in pee for long periods of time.  Pee before and after sex.  After pooping (having a bowel movement), women should wipe from front to back. Use each tissue only once.  Keep all follow-up visits as told by your doctor. This is important. GET HELP IF:  You do not feel better after 2 days.  Your symptoms get worse.  You have a fever. GET HELP RIGHT AWAY IF:  You cannot take your medicine or drink fluids as told.  You have chills and shaking.  You throw up (vomit).  You have very bad pain in your side (flank) or back.  You feel very weak or you pass out (faint).   This information is not intended to replace advice given to you by your health care provider. Make sure you discuss any questions you have with your health care provider.   Take antibiotics as directed. Would expect some improvement over the next 2-3 days. Return if not better return for any new or worse symptoms. Take pain medicine as needed. Take the antinausea medicine as needed.   Document Released: 10/09/2004 Document Revised: 05/23/2015 Document Reviewed: 12/25/2014 Elsevier Interactive Patient Education [employer]2016 [employer]

## 2016-01-24 NOTE — ED Provider Notes (Signed)
CSN: [id number]     Arrival date & time 01/24/16  0825 History  By signing my name below, I, Ronney Lion, attest that this documentation has been prepared under the direction and in the presence of Vanetta Mulders, MD. Electronically Signed: Ronney Lion, ED Scribe. 01/24/2016. 10:16 AM.   Chief Complaint  Patient presents with  . Headache   Patient is a 30 y.o. female presenting with back pain. The history is provided by the patient and medical records. No language interpreter was used.  Back Pain Pain location: low back. Radiates to: RUQ and right flank. Pain severity:  Severe Duration:  2 days Timing:  Constant Relieved by:  None tried Worsened by:  Nothing tried Ineffective treatments:  None tried Associated symptoms: abdominal pain, fever and headaches   Associated symptoms: no chest pain and no dysuria     HPI Comments: Stacey Griffin is a 30 y.o. female who presents to the Emergency Department with multiple complaints, including constant, 8/10, right-sided low back pain radiating to her right flank and RUQ, that began 2 days ago. She also complains of associated room-spinning dizziness, headache, nausea, 2 episodes of vomiting, fever, and chills. Per medical records, patient was seen at the ED here 2 days ago for similar symptoms. She had a UA and UPT that were negative. She denies a history of kidney stones. She denies cough, dysuria, diarrhea, visual changes, cough, rhinorrhea, sore throat, chest pain, SOB, hematuria, leg swelling, rash, or bleeding easily.   Past Medical History  Diagnosis Date  . Migraines    History reviewed. No pertinent past surgical history. History reviewed. No pertinent family history. Social History  Substance Use Topics  . Smoking status: Never Smoker   . Smokeless tobacco: None  . Alcohol Use: No   OB History    No data available     Review of Systems  Constitutional: Positive for fever and chills.  HENT: Negative for rhinorrhea and sore  throat.   Eyes: Negative for visual disturbance.  Respiratory: Negative for cough and shortness of breath.   Cardiovascular: Negative for chest pain and leg swelling.  Gastrointestinal: Positive for nausea, vomiting and abdominal pain. Negative for diarrhea.  Genitourinary: Negative for dysuria.  Musculoskeletal: Positive for back pain.  Skin: Negative for rash.  Neurological: Positive for dizziness and headaches.  Hematological: Does not bruise/bleed easily.      Allergies  Review of patient's allergies indicates no known allergies.  Home Medications   Prior to Admission medications   Medication Sig Start Date End Date Taking? Authorizing Provider  acetaminophen (TYLENOL) 325 MG tablet Take 650 mg by mouth as needed for mild pain.   Yes Historical Provider, MD  estradiol cypionate (DEPO-ESTRADIOL) 5 MG/ML injection Inject into the muscle every 3 (three) months.   Yes Historical Provider, MD  HYDROcodone-acetaminophen (NORCO/VICODIN) 5-325 MG tablet Take 1 tablet by mouth every 4 (four) hours as needed. 01/22/16  Yes Ivery Quale, PA-C  ibuprofen (ADVIL,MOTRIN) 200 MG tablet Take 800 mg by mouth once.   Yes Historical Provider, MD  nitrofurantoin (MACRODANTIN) 100 MG capsule 100 mg 2 (two) times daily.  11/15/15  Yes Historical Provider, MD  nystatin (MYCOSTATIN) powder Apply topically 2 (two) times daily.  11/13/15  Yes Historical Provider, MD  ondansetron (ZOFRAN) 4 MG tablet Take 1 tablet (4 mg total) by mouth every 6 (six) hours. 01/22/16  Yes Ivery Quale, PA-C  cephALEXin (KEFLEX) 500 MG capsule Take 1 capsule (500 mg total) by mouth 4 (four) times  daily. 01/24/16   Vanetta Mulders, MD  HYDROcodone-acetaminophen (NORCO/VICODIN) 5-325 MG tablet Take 1-2 tablets by mouth every 6 (six) hours as needed. 01/24/16   Vanetta Mulders, MD  predniSONE (DELTASONE) 10 MG tablet 6, 5, 4, 3, 2 then 1 tablet by mouth daily for 6 days total. Patient not taking: Reported on 01/24/2016 09/24/14   Burgess Amor, PA-C  promethazine (PHENERGAN) 25 MG tablet Take 1 tablet (25 mg total) by mouth every 6 (six) hours as needed. 01/24/16   Vanetta Mulders, MD   BP 137/84 mmHg  Pulse 84  Temp(Src) 98.9 F (37.2 C) (Oral)  Resp 18  Ht 5\' 1"  (1.549 m)  Wt 154.223 kg  BMI 64.28 kg/m2  SpO2 100%  LMP  Physical Exam  Constitutional: She is oriented to person, place, and time. She appears well-developed and well-nourished. No distress.  HENT:  Head: Normocephalic and atraumatic.  Mouth/Throat: Oropharynx is clear and moist.  Mucous membranes are moist.   Eyes: Conjunctivae and EOM are normal. Pupils are equal, round, and reactive to light. No scleral icterus.  Pupils are normal. Sclera is clear. Eyes track normal.    Neck: Neck supple. No tracheal deviation present.  Cardiovascular: Normal rate, regular rhythm and normal heart sounds.   Pulmonary/Chest: Effort normal and breath sounds normal. No respiratory distress. She has no wheezes. She has no rales.  Lungs are clear to auscultation bilaterally.  Abdominal: Soft. Bowel sounds are normal. There is no tenderness.  RUQ, right flank, and right back tenderness.  Musculoskeletal: Normal range of motion. She exhibits no edema.  No ankle swelling.  Neurological: She is alert and oriented to person, place, and time.  Skin: Skin is warm and dry.  Psychiatric: She has a normal mood and affect. Her behavior is normal.  Nursing note and vitals reviewed.   ED Course  Procedures (including critical care time)  DIAGNOSTIC STUDIES: Oxygen Saturation is 96% on RA, normal by my interpretation.    COORDINATION OF CARE: 10:14 AM - Discussed treatment plan with pt at bedside which includes diagnostic tests. Pt verbalized understanding and agreed to plan.   Labs Review Labs Reviewed  URINALYSIS, ROUTINE W REFLEX MICROSCOPIC (NOT AT [facility]) - Abnormal; Notable for the following:    Hgb urine dipstick LARGE (*)    Bilirubin Urine SMALL (*)    Ketones, ur 15  (*)    Protein, ur >300 (*)    All other components within normal limits  CBC WITH DIFFERENTIAL/PLATELET - Abnormal; Notable for the following:    WBC 11.9 (*)    Hemoglobin 11.1 (*)    HCT 35.1 (*)    MCV 74.7 (*)    MCH 23.6 (*)    Neutro Abs 9.6 (*)    All other components within normal limits  COMPREHENSIVE METABOLIC PANEL - Abnormal; Notable for the following:    Potassium 3.1 (*)    Chloride 100 (*)    Glucose, Bld 103 (*)    Creatinine, Ser 1.04 (*)    Calcium 8.8 (*)    Albumin 3.2 (*)    ALT 13 (*)    All other components within normal limits  URINE MICROSCOPIC-ADD ON - Abnormal; Notable for the following:    Squamous Epithelial / LPF TOO NUMEROUS TO COUNT (*)    Bacteria, UA MANY (*)    All other components within normal limits  URINE CULTURE  LIPASE, BLOOD    Imaging Review Dg Chest 2 View  01/24/2016  CLINICAL DATA:  Fever. EXAM: CHEST  2 VIEW COMPARISON:  August 24, 2012. FINDINGS: The heart size and mediastinal contours are within normal limits. Both lungs are clear. The visualized skeletal structures are unremarkable. IMPRESSION: No active cardiopulmonary disease. Electronically Signed   By: Lupita RaiderJames  Green Jr, M.D.   On: 01/24/2016 11:24   Ct Abdomen Pelvis W Contrast  01/24/2016  CLINICAL DATA:  Right low back pain, right flank pain, right upper quadrant pain for 2 days EXAM: CT ABDOMEN AND PELVIS WITH CONTRAST TECHNIQUE: Multidetector CT imaging of the abdomen and pelvis was performed using the standard protocol following bolus administration of intravenous contrast. CONTRAST:  125mL ISOVUE-300 IOPAMIDOL (ISOVUE-300) INJECTION 61% COMPARISON:  None. FINDINGS: Lower chest:  Lung bases are unremarkable.  Small hiatal hernia. Hepatobiliary: No masses or other significant abnormality.The study is markedly limited by streak artifacts from patient's large body habitus. Enhanced liver shows no focal mass. No calcified gallstones are noted within gallbladder. Pancreas: No  mass, inflammatory changes, or other significant abnormality. Spleen: Within normal limits in size and appearance. Adrenals/Urinary Tract: No adrenal gland mass. Kidneys are symmetrical in enhancement. There is mild right hydronephrosis. Mild proximal right hydroureter. Significant right perinephric and proximal right periureteral stranding. No calcified calculi are noted along the right ureter. Findings suspicious for a recent passed right ureteral calculus, significant right urinary tract infection/inflammation. A noncalcified right ureteral calculus or distal right ureteral stricture cannot be excluded. Clinical correlation is necessary. Bilateral distal ureter appears small caliber. The urinary bladder is empty limiting its assessment. No definite calcified calculi are noted distal ureter region or within urinary bladder. Stomach/Bowel: There is no gastric outlet obstruction. No small bowel obstruction. No thickened or dilated small bowel loops. No pericecal inflammation. Terminal ileum is unremarkable No distal colonic obstruction. Vascular/Lymphatic: No aortic aneurysm. No retroperitoneal or mesenteric adenopathy. Reproductive: The uterus and ovaries are unremarkable. No adnexal masses noted. No pelvic free fluid. Other: No ascites or free abdominal air. There is no inguinal adenopathy. Musculoskeletal: No destructive bony lesions are noted. Mild degenerative changes lower thoracic spine. No destructive bony lesions are noted within pelvis. IMPRESSION: 1. Suboptimal exam due to patient's large body habitus. There is mild right hydronephrosis. Mild proximal right hydroureter. Significant right perinephric and proximal right periureteral stranding. No calcified calculi are noted along the right ureter. Findings suspicious for a recent passed right ureteral calculus, significant right urinary tract infection/inflammation. A noncalcified right ureteral calculus or distal right ureteral stricture cannot be  excluded. Clinical correlation is necessary. 2. Bilateral ureter is small caliber. Limited assessment of urinary bladder which is empty. No definite calcified calculi are noted within distal ureter of urinary bladder. 3. No small bowel obstruction. 4. Normal appendix.  No pericecal inflammation. 5. No ascites or free air. Electronically Signed   By: Natasha MeadLiviu  Pop M.D.   On: 01/24/2016 11:28   I have personally reviewed and evaluated these images and lab results as part of my medical decision-making.   EKG Interpretation None      MDM   Final diagnoses:  Pyelonephritis   Symptoms seem to be most consistent with right-sided pyelonephritis both clinically and based on the CT findings. Will treat here with 1 g of Rocephin and continue Keflex at home. Urine culture sent and pending. When patient was seen the other day urine culture was not sent. Patient with some low-grade fever but nontoxic no acute distress. No evidence of any sepsis parameters. Patient given precautions to return if not improving in 2-3 days or  if she gets worse.   I personally performed the services described in this documentation, which was scribed in my presence. The recorded information has been reviewed and is accurate.       Vanetta Mulders, MD 01/24/16 1404

## 2016-01-24 NOTE — ED Notes (Addendum)
Pt reports was diagnosed with a virus on Tuesday. Pt reports headache and lower back pain radiating to RLQ of abd. Pt reports emesis x1. nad noted. Pt reports intermittent fever dizziness since tuesday. Last took ibuprofen at 530 this am.

## 2016-01-24 NOTE — ED Notes (Signed)
Pt c/o continued nausea/dizziness and right lower back pain. Pt seen in ED Tuesday for same and dx with viral illness.

## 2016-01-24 NOTE — ED Notes (Signed)
Pt ambulatory to room without complaint of dizziness. Voiced several non acute problems

## 2016-01-25 LAB — URINE CULTURE

## 2019-06-09 ENCOUNTER — Encounter (HOSPITAL_COMMUNITY): Payer: Self-pay | Admitting: Emergency Medicine

## 2019-06-09 ENCOUNTER — Other Ambulatory Visit: Payer: Self-pay

## 2019-06-09 ENCOUNTER — Emergency Department (HOSPITAL_COMMUNITY)
Admission: EM | Admit: 2019-06-09 | Discharge: 2019-06-09 | Disposition: A | Discharge status: Home / Self Care | Payer: BC Managed Care – PPO | Attending: Emergency Medicine | Admitting: Emergency Medicine

## 2019-06-09 ENCOUNTER — Emergency Department (HOSPITAL_COMMUNITY): Payer: BC Managed Care – PPO

## 2019-06-09 DIAGNOSIS — I10 Essential (primary) hypertension: Secondary | ICD-10-CM | POA: Diagnosis not present

## 2019-06-09 DIAGNOSIS — R519 Headache, unspecified: Secondary | ICD-10-CM

## 2019-06-09 DIAGNOSIS — R51 Headache: Secondary | ICD-10-CM | POA: Insufficient documentation

## 2019-06-09 DIAGNOSIS — Z79899 Other long term (current) drug therapy: Secondary | ICD-10-CM | POA: Insufficient documentation

## 2019-06-09 HISTORY — DX: Essential (primary) hypertension: I10

## 2019-06-09 MED ORDER — METOCLOPRAMIDE HCL 10 MG PO TABS
10.0000 mg | ORAL_TABLET | Freq: Once | ORAL | Status: AC
  Administered 2019-06-09: 13:00:00 10 mg via ORAL
  Filled 2019-06-09: qty 1

## 2019-06-09 MED ORDER — DIPHENHYDRAMINE HCL 25 MG PO CAPS
25.0000 mg | ORAL_CAPSULE | Freq: Once | ORAL | Status: AC
  Administered 2019-06-09: 13:00:00 25 mg via ORAL
  Filled 2019-06-09: qty 1

## 2019-06-09 MED ORDER — KETOROLAC TROMETHAMINE 30 MG/ML IJ SOLN
30.0000 mg | Freq: Once | INTRAMUSCULAR | Status: AC
  Administered 2019-06-09: 14:00:00 30 mg via INTRAMUSCULAR
  Filled 2019-06-09: qty 1

## 2019-06-09 NOTE — ED Notes (Signed)
Pt taken to ct 

## 2019-06-09 NOTE — ED Triage Notes (Signed)
Headache on and off since yesterday.  bp is elevated, reports taking her medication this morning.

## 2019-06-09 NOTE — ED Provider Notes (Signed)
[facility] EMERGENCY DEPARTMENT Provider Note   CSN: [id number] Arrival date & time: 06/09/19  1123     History   Chief Complaint Chief Complaint  Patient presents with   Headache    HPI Stacey Griffin is a 33 y.o. female.     33 y.o female with a past medical history of hypertension presents to the ED with a chief complaint of headache times yesterday.  Patient describes this as a sharp stabbing sensation to the right side of her temporal region with radiation to her neck.  This pain is worse with light, movement.  She does report having a prior history of headaches, has never been diagnosed with migraines.  States "does not really feel like my usual headaches.  Patient also endorses left eye fogginess which began yesterday, states every time she blinks her eye and opens she sees a spotty white layer to it.  Patient has taken Excedrin x2 for the past 2 days without improvement in symptoms.  She denies any nausea, vomiting, phonophobia, dizziness or fevers. Of note patient does have a family history of aneurysms, reports grandmother died due to an aneurysm.  She is currently not on any blood thinners and denies any trauma.  The history is provided by the patient.  Headache Pain location:  R temporal Quality:  Sharp Radiates to:  R neck Severity currently:  4/10 Onset quality:  Sudden Duration:  1 day Timing:  Intermittent Progression:  Worsening Chronicity:  Recurrent Similar to prior headaches: no   Context: bright light and caffeine   Relieved by:  Nothing Worsened by:  Nothing Ineffective treatments:  Acetaminophen Associated symptoms: blurred vision and photophobia   Associated symptoms: no abdominal pain, no cough, no diarrhea, no dizziness, no eye pain, no facial pain, no fever, no hearing loss, no nausea, no neck stiffness, no sore throat and no vomiting   Risk factors: family hx of SAH and sedentary lifestyle   Risk factors: does not have insomnia     Past  Medical History:  Diagnosis Date   Hypertension    Migraines        OB History   No obstetric history on file.      Home Medications    Prior to Admission medications   Medication Sig Start Date End Date Taking? Authorizing Provider  acetaminophen (TYLENOL) 325 MG tablet Take 650 mg by mouth as needed for mild pain.    [provider]  cephALEXin (KEFLEX) 500 MG capsule Take 1 capsule (500 mg total) by mouth 4 (four) times daily. 01/24/16   Vanetta MuldersZackowski, Scott, MD  estradiol cypionate (DEPO-ESTRADIOL) 5 MG/ML injection Inject into the muscle every 3 (three) months.    [provider]  HYDROcodone-acetaminophen (NORCO/VICODIN) 5-325 MG tablet Take 1 tablet by mouth every 4 (four) hours as needed. 01/22/16   Ivery QualeBryant, Hobson, PA-C  HYDROcodone-acetaminophen (NORCO/VICODIN) 5-325 MG tablet Take 1-2 tablets by mouth every 6 (six) hours as needed. 01/24/16   Vanetta MuldersZackowski, Scott, MD  ibuprofen (ADVIL,MOTRIN) 200 MG tablet Take 800 mg by mouth once.    [provider]  nitrofurantoin (MACRODANTIN) 100 MG capsule 100 mg 2 (two) times daily.  11/15/15   [provider]  nystatin (MYCOSTATIN) powder Apply topically 2 (two) times daily.  11/13/15   [provider]  ondansetron (ZOFRAN) 4 MG tablet Take 1 tablet (4 mg total) by mouth every 6 (six) hours. 01/22/16   Ivery QualeBryant, Hobson, PA-C  predniSONE (DELTASONE) 10 MG tablet 6, 5, 4, 3,  2 then 1 tablet by mouth daily for 6 days total. Patient not taking: Reported on 01/24/2016 09/24/14   Burgess Amor, PA-C  promethazine (PHENERGAN) 25 MG tablet Take 1 tablet (25 mg total) by mouth every 6 (six) hours as needed. 01/24/16   Vanetta Mulders, MD    Family History No family history on file.  Social History Social History   Tobacco Use   Smoking status: Never Smoker   Smokeless tobacco: Never Used  Substance Use Topics   Alcohol use: No   Drug use: No     Allergies   Patient has no known  allergies.   Review of Systems Review of Systems  Constitutional: Negative for fever.  HENT: Negative for hearing loss and sore throat.   Eyes: Positive for blurred vision, photophobia and visual disturbance. Negative for pain, discharge, redness and itching.  Respiratory: Negative for cough.   Cardiovascular: Negative for chest pain.  Gastrointestinal: Negative for abdominal pain, diarrhea, nausea and vomiting.  Genitourinary: Negative for flank pain.  Musculoskeletal: Negative for neck stiffness.  Neurological: Positive for headaches. Negative for dizziness.     Physical Exam Updated Vital Signs BP (!) 141/89    Pulse 66    Temp 98.8 F (37.1 C) (Oral)    Resp (!) 22    Ht 5\' 2"  (1.575 m)    Wt (!) 155.6 kg    SpO2 99%    BMI 62.74 kg/m   Physical Exam Vitals signs and nursing note reviewed.  Constitutional:      Appearance: She is well-developed. She is obese. She is not ill-appearing.  HENT:     Head: Normocephalic and atraumatic.      Nose: No congestion or rhinorrhea.  Eyes:     Extraocular Movements: Extraocular movements intact.     Left eye: Nystagmus present.     Pupils:     Right eye: Pupil is round and reactive.     Left eye: Pupil is round and reactive.  Neck:     Musculoskeletal: Normal range of motion and neck supple.     Meningeal: Brudzinski's sign and Kernig's sign absent.  Cardiovascular:     Rate and Rhythm: Normal rate.     Heart sounds: Normal heart sounds.  Pulmonary:     Effort: Pulmonary effort is normal.     Breath sounds: Normal breath sounds. No wheezing or rhonchi.     Comments: Lungs are clear to auscultation. Abdominal:     General: Bowel sounds are normal.     Palpations: Abdomen is soft.  Musculoskeletal: Normal range of motion.        General: No swelling or tenderness.  Skin:    General: Skin is warm and dry.  Neurological:     Mental Status: She is alert.     Cranial Nerves: No cranial nerve deficit or dysarthria.      Comments: No dysarthria, facial asymmetry, deficit on my exam.      ED Treatments / Results  Labs (all labs ordered are listed, but only abnormal results are displayed) Labs Reviewed - No data to display  EKG None  Radiology Ct Head Wo Contrast  Result Date: 06/09/2019 CLINICAL DATA:  Headache EXAM: CT HEAD WITHOUT CONTRAST TECHNIQUE: Contiguous axial images were obtained from the base of the skull through the vertex without intravenous contrast. COMPARISON:  None. FINDINGS: Brain: No evidence of acute infarction, hemorrhage, hydrocephalus, extra-axial collection or mass lesion/mass effect. Vascular: No hyperdense vessel or unexpected calcification. Skull: Normal.  Negative for fracture or focal lesion. Sinuses/Orbits: No acute finding. Other: None. IMPRESSION: No acute intracranial pathology. No non-contrast CT findings to explain headache. Electronically Signed   By: Eddie Candle M.D.   On: 06/09/2019 14:22    Procedures Procedures (including critical care time)  Medications Ordered in ED Medications  metoCLOPramide (REGLAN) tablet 10 mg (10 mg Oral Given 06/09/19 1240)  diphenhydrAMINE (BENADRYL) capsule 25 mg (25 mg Oral Given 06/09/19 1240)  ketorolac (TORADOL) 30 MG/ML injection 30 mg (30 mg Intramuscular Given 06/09/19 1420)     Initial Impression / Assessment and Plan / ED Course  I have reviewed the triage vital signs and the nursing notes.  Pertinent labs & imaging results that were available during my care of the patient were reviewed by me and considered in my medical decision making (see chart for details).        Patient with a past medical history of headaches presents to the ED with complaints of headache since yesterday, reports a sharp sensation to the right temporal region, this is worse with light, not improved by Excedrin x2.  Arrived in the ED hypertensive with a blood pressure 169/105, she is currently on amlodipine and reports compliance with her blood  pressure medication. Reports headache onset was sudden, does endorse some foggy sensation to her right eye, seeing like white spots upon opening of her eye.   Patient was provided with a headache cocktail, she was reassessed by me, reports no improvement at this time on her headache.  She reports the pain is about a 4 out of 10, she states "I am not concerned about the pain I am more concerned of something happening in my head can I get imaging of my head ".  Discussed risks and benefits of obtaining CT imaging at this time as she does not have any trauma, red flags, does have a prior history of headaches but not been diagnosed with migraines.  At this time will order CT head to rule out any acute intracranial process.  Patient ambulatory in her room with a steady gait.  CT head showed no intracranial hemorrhage, infarct.  Patient received 80 mg of Toradol IM, will reevaluate patient prior to discharge.  3:01 PM spoke to patient about CT results.  CT head without any infarct, acute hemorrhage.  Patient symptoms are completely resolved after Toradol therapy.  She will be sent home with a referral for neurology to further evaluate her condition.  Patient understands and agrees with management at this time, return precautions discussed at length.  Patient stable for discharge.   Portions of this note were generated with [employer]. Dictation errors may occur despite best attempts at proofreading.  Final Clinical Impressions(s) / ED Diagnoses   Final diagnoses:  Bad headache    ED Discharge Orders    None       Janeece Fitting, PA-C 06/09/19 1501    Noemi Chapel, MD 06/11/19 [phone]

## 2019-06-09 NOTE — Discharge Instructions (Addendum)
Your CT head today was normal.  A referral to neurology has been provided in order to obtain further management of your recurrent headaches.

## 2019-06-09 NOTE — ED Notes (Signed)
See triage notes. Responded to "not really " when asked if had blurred vision NAD

## 2019-06-09 NOTE — ED Notes (Signed)
Per PA, wait until after CT to give toradol

## 2019-09-12 ENCOUNTER — Encounter: Payer: Self-pay | Admitting: Emergency Medicine

## 2019-09-12 ENCOUNTER — Ambulatory Visit
Admission: EM | Admit: 2019-09-12 | Discharge: 2019-09-12 | Disposition: A | Discharge status: Home / Self Care | Payer: BC Managed Care – PPO | Attending: Emergency Medicine | Admitting: Emergency Medicine

## 2019-09-12 ENCOUNTER — Other Ambulatory Visit: Payer: Self-pay

## 2019-09-12 DIAGNOSIS — J039 Acute tonsillitis, unspecified: Secondary | ICD-10-CM | POA: Diagnosis present

## 2019-09-12 LAB — POCT RAPID STREP A (OFFICE): Rapid Strep A Screen: NEGATIVE

## 2019-09-12 MED ORDER — AMOXICILLIN 500 MG PO TABS: 500.0000 mg | ORAL_TABLET | Freq: Three times a day (TID) | ORAL | 0 refills | Status: AC

## 2019-09-12 NOTE — ED Provider Notes (Signed)
Stacey Griffin    CSN: [id number] Arrival date & time: 09/12/19  1233      History   Chief Complaint Chief Complaint  Patient presents with  . Sore Throat    HPI Stacey Griffin is a 33 y.o. female.   Patient presents with 1 week history of sore throat, worse x2 days.  She denies difficulty swallowing or breathing.  She denies fever, chills, ear pain, cough, shortness of breath, vomiting, diarrhea, rash, or other symptoms.  She has treated her symptoms at home with Tylenol.    The history is provided by the patient.    Past Medical History:  Diagnosis Date  . Hypertension   . Migraines     There are no problems to display for this patient.   History reviewed. No pertinent surgical history.  OB History   No obstetric history on file.      Home Medications    Prior to Admission medications   Medication Sig Start Date End Date Taking? Authorizing Provider  amLODipine (NORVASC) 2.5 MG tablet Take 2.5 mg by mouth daily.   Yes [provider]  acetaminophen (TYLENOL) 325 MG tablet Take 650 mg by mouth as needed for mild pain.    [provider]  amoxicillin (AMOXIL) 500 MG tablet Take 1 tablet (500 mg total) by mouth 3 (three) times daily. 09/12/19   Sharion Balloon, NP  cephALEXin (KEFLEX) 500 MG capsule Take 1 capsule (500 mg total) by mouth 4 (four) times daily. 01/24/16   Fredia Sorrow, MD  estradiol cypionate (DEPO-ESTRADIOL) 5 MG/ML injection Inject into the muscle every 3 (three) months.    [provider]  HYDROcodone-acetaminophen (NORCO/VICODIN) 5-325 MG tablet Take 1 tablet by mouth every 4 (four) hours as needed. 01/22/16   Lily Kocher, PA-C  HYDROcodone-acetaminophen (NORCO/VICODIN) 5-325 MG tablet Take 1-2 tablets by mouth every 6 (six) hours as needed. 01/24/16   Fredia Sorrow, MD  ibuprofen (ADVIL,MOTRIN) 200 MG tablet Take 800 mg by mouth once.    [provider]  nitrofurantoin (MACRODANTIN) 100 MG capsule  100 mg 2 (two) times daily.  11/15/15   [provider]  nystatin (MYCOSTATIN) powder Apply topically 2 (two) times daily.  11/13/15   [provider]  ondansetron (ZOFRAN) 4 MG tablet Take 1 tablet (4 mg total) by mouth every 6 (six) hours. 01/22/16   Lily Kocher, PA-C  predniSONE (DELTASONE) 10 MG tablet 6, 5, 4, 3, 2 then 1 tablet by mouth daily for 6 days total. Patient not taking: Reported on 01/24/2016 09/24/14   Evalee Jefferson, PA-C  promethazine (PHENERGAN) 25 MG tablet Take 1 tablet (25 mg total) by mouth every 6 (six) hours as needed. 01/24/16   Fredia Sorrow, MD    Family History History reviewed. No pertinent family history.  Social History Social History   Tobacco Use  . Smoking status: Never Smoker  . Smokeless tobacco: Never Used  Substance Use Topics  . Alcohol use: No  . Drug use: No     Allergies   Patient has no known allergies.   Review of Systems Review of Systems  Constitutional: Negative for chills and fever.  HENT: Positive for sore throat. Negative for ear pain and trouble swallowing.   Eyes: Negative for pain and visual disturbance.  Respiratory: Negative for cough and shortness of breath.   Cardiovascular: Negative for chest pain and palpitations.  Gastrointestinal: Negative for abdominal pain, diarrhea, nausea and vomiting.  Genitourinary: Negative for dysuria and hematuria.  Musculoskeletal: Negative for arthralgias and back pain.  Skin: Negative for color change and rash.  Neurological: Negative for seizures and syncope.  All other systems reviewed and are negative.    Physical Exam Triage Vital Signs ED Triage Vitals  Enc Vitals Group     BP 09/12/19 1242 137/87     Pulse Rate 09/12/19 1242 83     Resp 09/12/19 1242 18     Temp 09/12/19 1242 98.9 F (37.2 C)     Temp src --      SpO2 09/12/19 1242 95 %     Weight 09/12/19 1243 (!) 351 lb (159.2 kg)     Height --      Head Circumference --      Peak Flow --      Pain  Score 09/12/19 1243 5     Pain Loc --      Pain Edu? --      Excl. in [place]? --    No data found.  Updated Vital Signs BP 137/87   Pulse 83   Temp 98.9 F (37.2 C)   Resp 18   Wt (!) 351 lb (159.2 kg)   LMP 08/25/2019   SpO2 95%   BMI 64.20 kg/m   Visual Acuity Right Eye Distance:   Left Eye Distance:   Bilateral Distance:    Right Eye Near:   Left Eye Near:    Bilateral Near:     Physical Exam Vitals and nursing note reviewed.  Constitutional:      General: She is not in acute distress.    Appearance: She is well-developed. She is obese.  HENT:     Head: Normocephalic and atraumatic.     Right Ear: Tympanic membrane normal.     Left Ear: Tympanic membrane normal.     Nose: Nose normal.     Mouth/Throat:     Mouth: Mucous membranes are moist.     Pharynx: Posterior oropharyngeal erythema present.     Tonsils: Tonsillar exudate present. 3+ on the right. 3+ on the left.  Eyes:     Conjunctiva/sclera: Conjunctivae normal.  Cardiovascular:     Rate and Rhythm: Normal rate and regular rhythm.     Heart sounds: No murmur.  Pulmonary:     Effort: Pulmonary effort is normal. No respiratory distress.     Breath sounds: Normal breath sounds.  Abdominal:     General: Bowel sounds are normal.     Palpations: Abdomen is soft.     Tenderness: There is no abdominal tenderness. There is no guarding or rebound.  Musculoskeletal:     Cervical back: Neck supple.  Lymphadenopathy:     Cervical: Cervical adenopathy present.  Skin:    General: Skin is warm and dry.     Findings: No rash.  Neurological:     General: No focal deficit present.     Mental Status: She is alert and oriented to person, place, and time.  Psychiatric:        Mood and Affect: Mood normal.        Behavior: Behavior normal.      UC Treatments / Results  Labs (all labs ordered are listed, but only abnormal results are displayed) Labs Reviewed  CULTURE, GROUP A STREP [facility])  POCT RAPID STREP A  (OFFICE)    EKG   Radiology No results found.  Procedures Procedures (including critical care time)  Medications Ordered in UC Medications - No data to display  Initial Impression / Assessment and Plan / UC Course  I have reviewed the triage vital signs and the nursing notes.  Pertinent labs & imaging results that were available during my care of the patient were reviewed by me and considered in my medical decision making (see chart for details).    Acute tonsillitis.  Rapid strep negative; throat culture pending.  Treating with amoxicillin.  Instructed her to take Tylenol as needed for discomfort.  Instructed her to follow-up with [facility].  Discussed with patient that she should go to the emergency department if she has difficulty swallowing or breathing.  Patient agrees to this plan of care.     Final Clinical Impressions(s) / UC Diagnoses   Final diagnoses:  Acute tonsillitis, unspecified etiology     Discharge Instructions     Take the amoxicillin as directed.  Take Tylenol as needed for your discomfort.    Follow-up with [facility] listed below: call them to schedule an appointment.    Go to the emergency department if you have difficulty swallowing or breathing.        ED Prescriptions    Medication Sig Dispense Auth. Provider   amoxicillin (AMOXIL) 500 MG tablet Take 1 tablet (500 mg total) by mouth 3 (three) times daily. 21 tablet Jaselynn Tamas, KellMickie Baily H, NP     PDMP not reviewed this encounter.   Mickie Bailate, Rocklyn Mayberry H, NP 09/12/19 1308

## 2019-09-12 NOTE — ED Triage Notes (Signed)
Patient in office today c/o sore throat x 2d ago   OTC: Tylenol  Denies:Fever,chills,body ache

## 2019-09-12 NOTE — Discharge Instructions (Addendum)
Take the amoxicillin as directed.  Take Tylenol as needed for your discomfort.    Follow-up with [facility] listed below: call them to schedule an appointment.    Go to the emergency department if you have difficulty swallowing or breathing.

## 2019-09-14 LAB — CULTURE, GROUP A STREP (THRC)

## 2019-11-12 ENCOUNTER — Ambulatory Visit: Payer: BC Managed Care – PPO

## 2020-03-07 IMAGING — CT CT HEAD W/O CM
3 series · 16 of 47 positions shown, 19 images · non-contrast
Comparison: None.

CLINICAL DATA: Headache

EXAM:
CT HEAD WITHOUT CONTRAST
TECHNIQUE: Contiguous axial images were obtained from the base of the skull
through the vertex without intravenous contrast.

[Series 3: head w o · axial · 0.42mm/px · z∈[+78,+208]mm · 10 of 32 slices shown, 13 images]
[im 3/32  brain]
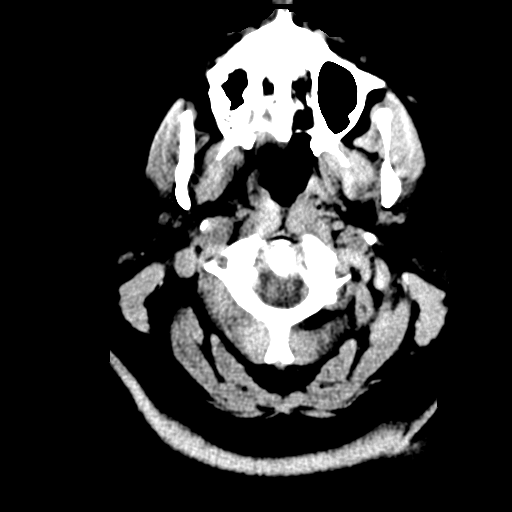
[im 3/32  bone]
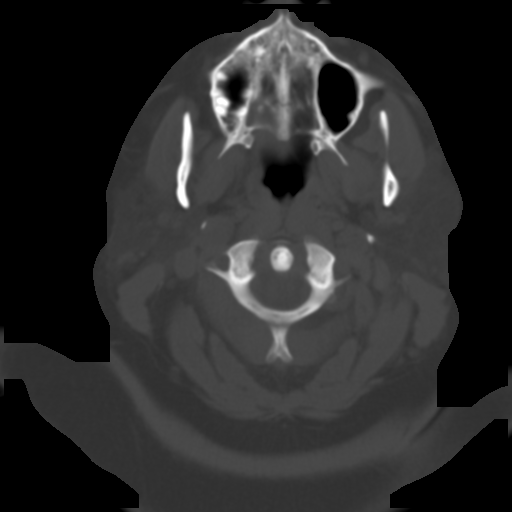
[im 6/32  brain]
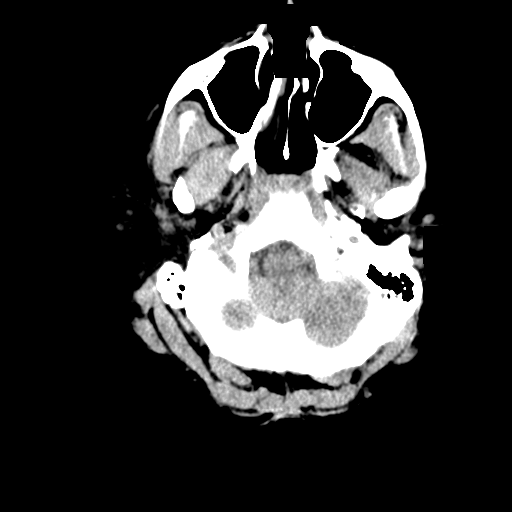
[im 9/32  brain]
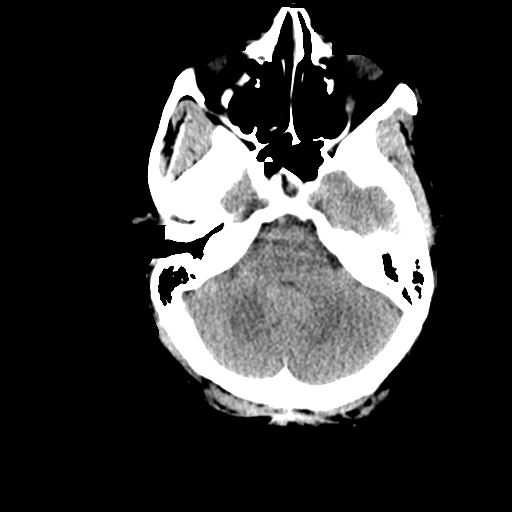
[im 11/32  brain]
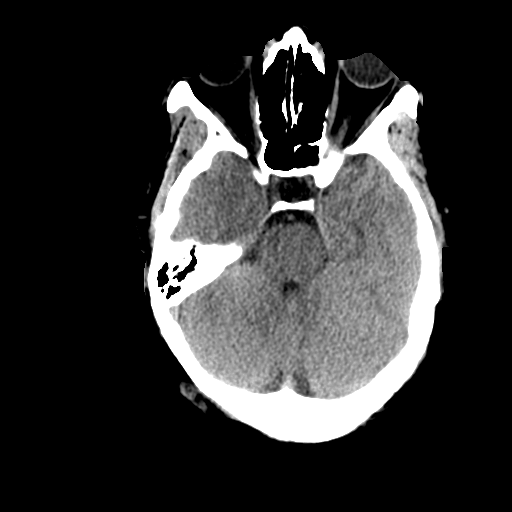
[im 14/32  brain]
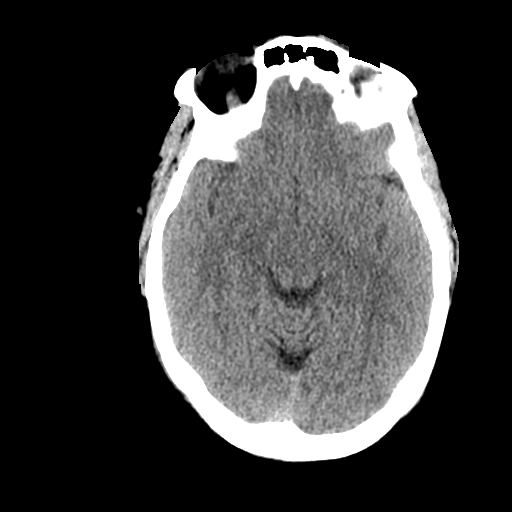
[im 14/32  bone]
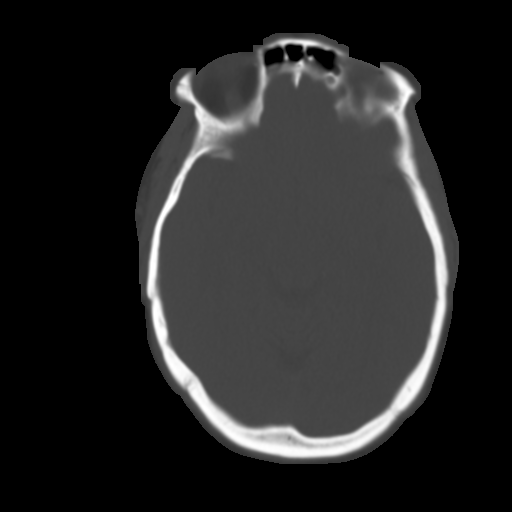
[im 18/32  brain]
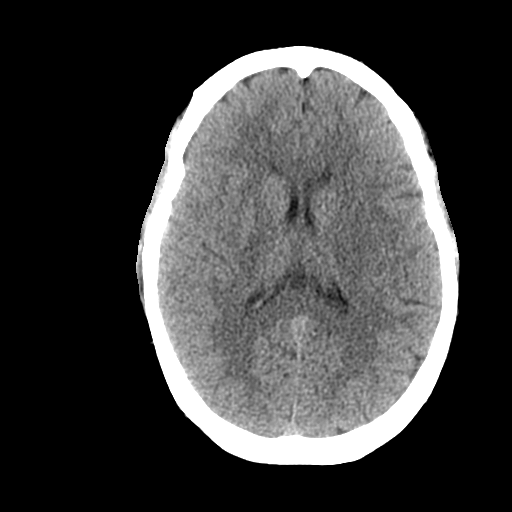
[im 21/32  brain]
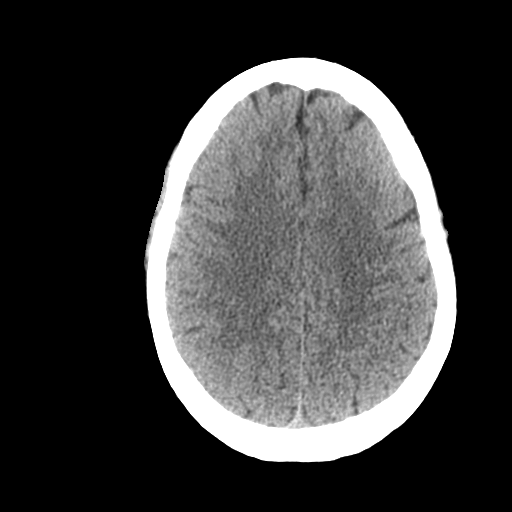
[im 24/32  brain]
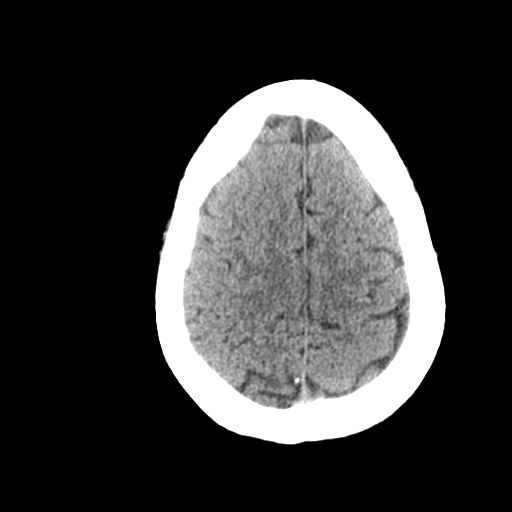
[im 26/32  brain]
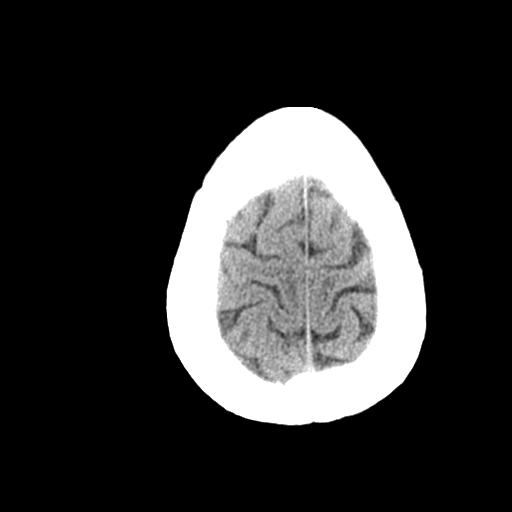
[im 26/32  bone]
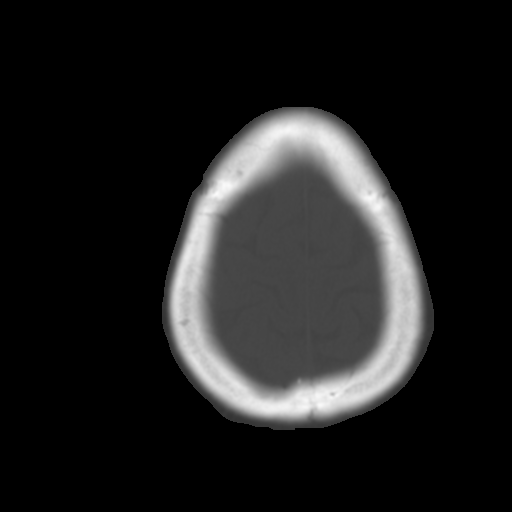
[im 29/32  brain]
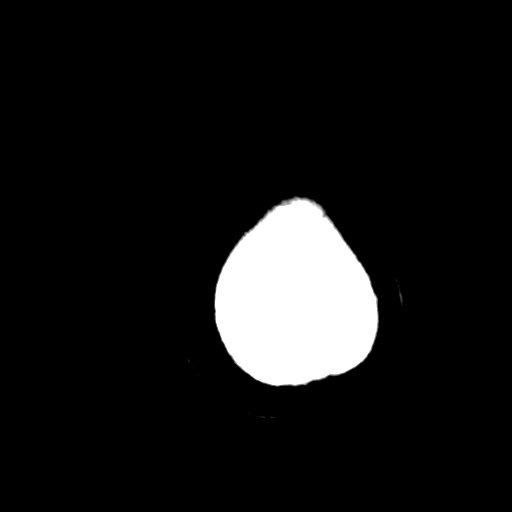

[Series 5: coronal soft · coronal · 0.30mm/px · 3 of 83 slices shown]
[im 28/83  brain]
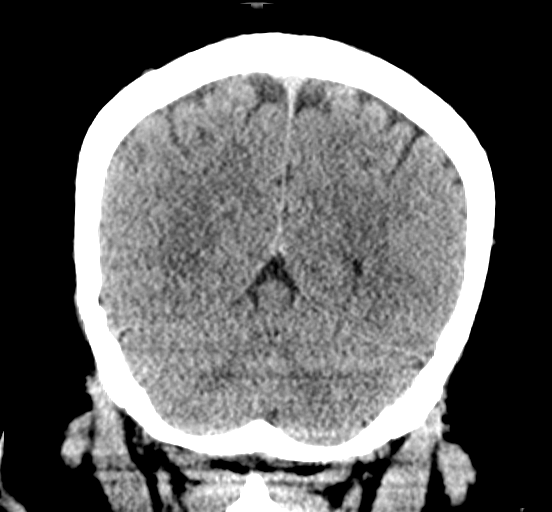
[im 37/83  brain]
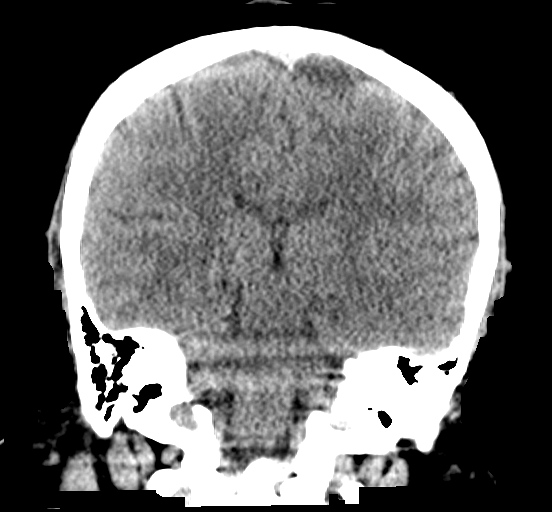
[im 46/83  brain]
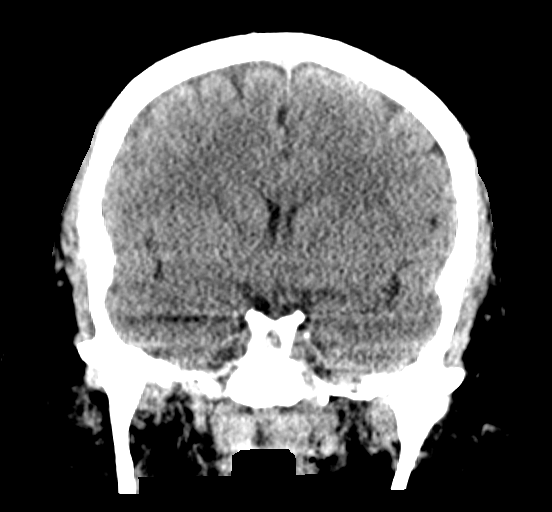

[Series 6: sagittal soft · sagittal · 0.34mm/px · 3 of 58 slices shown]
[im 20/58  brain]
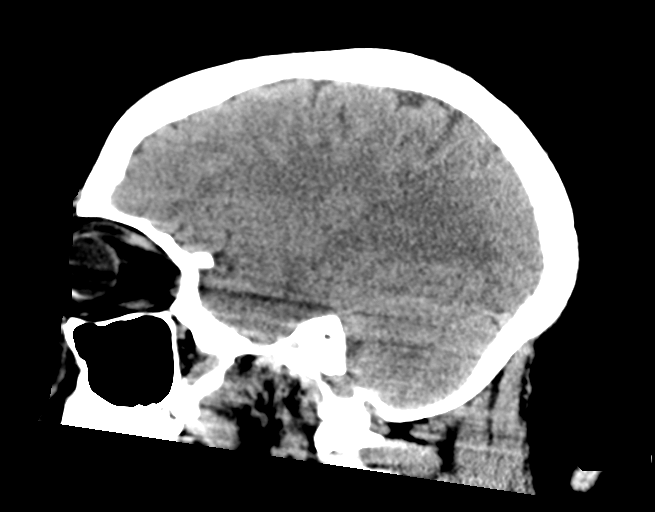
[im 29/58  brain]
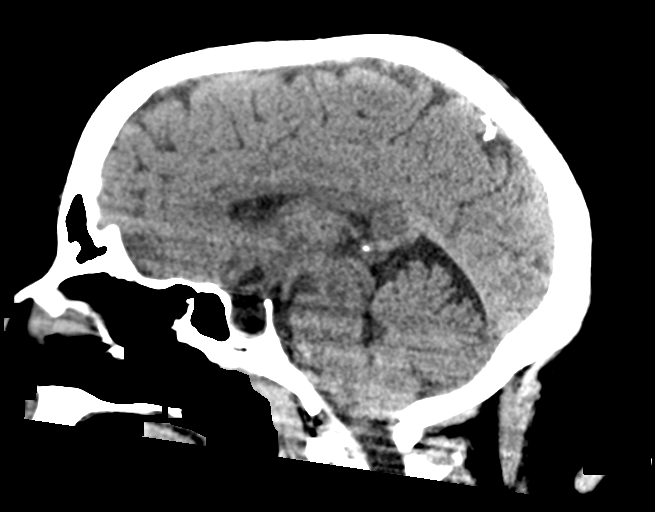
[im 39/58  brain]
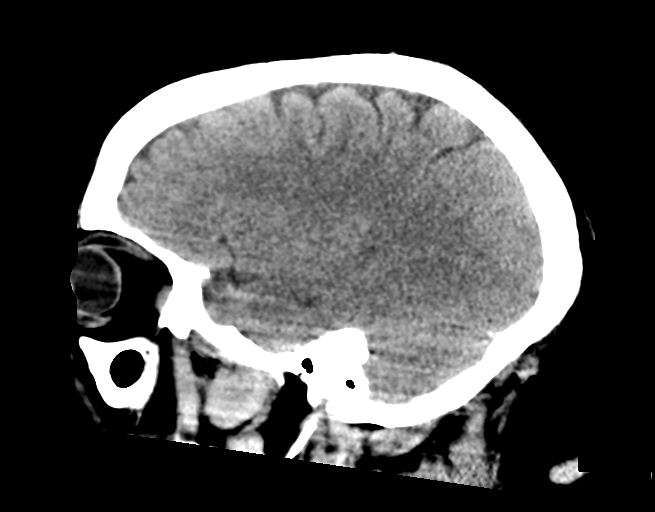

[16 of 47 positions shown; findings below may reference images not displayed]

FINDINGS: Brain: No evidence of acute infarction, hemorrhage, hydrocephalus,
extra-axial collection or mass lesion/mass effect.

Vascular: No hyperdense vessel or unexpected calcification.

Skull: Normal. Negative for fracture or focal lesion.

Sinuses/Orbits: No acute finding.

Other: None.
IMPRESSION: No acute intracranial pathology. No non-contrast CT findings to
explain headache.

## 2022-07-16 ENCOUNTER — Other Ambulatory Visit: Payer: Self-pay

## 2022-07-16 ENCOUNTER — Emergency Department
Admission: EM | Admit: 2022-07-16 | Discharge: 2022-07-16 | Disposition: A | Discharge status: Home / Self Care | Payer: 59 | Attending: Emergency Medicine | Admitting: Emergency Medicine

## 2022-07-16 ENCOUNTER — Emergency Department: Payer: 59

## 2022-07-16 DIAGNOSIS — R200 Anesthesia of skin: Secondary | ICD-10-CM | POA: Diagnosis not present

## 2022-07-16 DIAGNOSIS — M79604 Pain in right leg: Secondary | ICD-10-CM | POA: Diagnosis not present

## 2022-07-16 DIAGNOSIS — I10 Essential (primary) hypertension: Secondary | ICD-10-CM | POA: Insufficient documentation

## 2022-07-16 DIAGNOSIS — Z79899 Other long term (current) drug therapy: Secondary | ICD-10-CM | POA: Insufficient documentation

## 2022-07-16 MED ORDER — PREDNISONE 10 MG PO TABS: 10.0000 mg | ORAL_TABLET | Freq: Every day | ORAL | 0 refills | Status: AC

## 2022-07-16 NOTE — ED Notes (Signed)
Pt conscious and alert. C/O  bilateral leg pain as per triage.

## 2022-07-16 NOTE — ED Provider Notes (Signed)
[place] EMERGENCY DEPARTMENT Provider Note   CSN: [id number] Arrival date & time: 07/16/22  1458     History  Chief Complaint  Patient presents with   Leg Pain    Stacey Griffin is a 36 y.o. female.  With history of obesity, hypertension presents to the emergency department for evaluation of numbness and pain in her right leg.  Patient describes some pain and tenderness along the right lower leg anteriorly that she describes as numbness and tingling.  Symptoms been present for 2 weeks.  She denies any recent falls trauma or injury.  She has a history of chronic numbness in her right upper thigh this been present for years.  She denies any weakness, loss of bowel or bladder symptoms.  She is not taking any medications for pain.  HPI     Home Medications Prior to Admission medications   Medication Sig Start Date End Date Taking? Authorizing Provider  predniSONE (DELTASONE) 10 MG tablet Take 1 tablet (10 mg total) by mouth daily. 6,5,4,3,2,1 six day taper 07/16/22  Yes Duanne Guess, PA-C  acetaminophen (TYLENOL) 325 MG tablet Take 650 mg by mouth as needed for mild pain.    [provider]  amLODipine (NORVASC) 2.5 MG tablet Take 2.5 mg by mouth daily.    [provider]  amoxicillin (AMOXIL) 500 MG tablet Take 1 tablet (500 mg total) by mouth 3 (three) times daily. 09/12/19   Sharion Balloon, NP  cephALEXin (KEFLEX) 500 MG capsule Take 1 capsule (500 mg total) by mouth 4 (four) times daily. 01/24/16   Fredia Sorrow, MD  estradiol cypionate (DEPO-ESTRADIOL) 5 MG/ML injection Inject into the muscle every 3 (three) months.    [provider]  HYDROcodone-acetaminophen (NORCO/VICODIN) 5-325 MG tablet Take 1 tablet by mouth every 4 (four) hours as needed. 01/22/16   Lily Kocher, PA-C  HYDROcodone-acetaminophen (NORCO/VICODIN) 5-325 MG tablet Take 1-2 tablets by mouth every 6 (six) hours as needed. 01/24/16   Fredia Sorrow, MD   ondansetron (ZOFRAN) 4 MG tablet Take 1 tablet (4 mg total) by mouth every 6 (six) hours. 01/22/16   Lily Kocher, PA-C  promethazine (PHENERGAN) 25 MG tablet Take 1 tablet (25 mg total) by mouth every 6 (six) hours as needed. 01/24/16   Fredia Sorrow, MD      Allergies    Patient has no known allergies.    Review of Systems   Review of Systems  Physical Exam Updated Vital Signs BP (!) 172/98   Pulse 92   Temp 98.7 F (37.1 C)   Resp 17   LMP 07/09/2022   SpO2 100%  Physical Exam Constitutional:      Appearance: She is well-developed.  HENT:     Head: Normocephalic and atraumatic.  Eyes:     Conjunctiva/sclera: Conjunctivae normal.  Cardiovascular:     Rate and Rhythm: Normal rate.  Pulmonary:     Effort: Pulmonary effort is normal. No respiratory distress.  Musculoskeletal:     Cervical back: Normal range of motion.     Comments: Examination of the lumbar spine shows no spinous process tenderness or paravertebral muscle tenderness.  Normal hip range of motion bilaterally.  Good hip flexion, abduction bilaterally.  Normal ankle plantarflexion dorsiflexion and EHL strength bilaterally.  No clonus noted.  Sensation is intact throughout both lower extremities.  2+ dorsalis pedis pulses bilaterally.  She has no swelling warmth erythema or edema both legs.  She is negative Bevelyn Buckles' sign in both  legs.  She is tender along the right anterior shin, no warmth or redness noted.  Skin:    General: Skin is warm.     Findings: No rash.  Neurological:     Mental Status: She is alert and oriented to person, place, and time.  Psychiatric:        Behavior: Behavior normal.        Thought Content: Thought content normal.     ED Results / Procedures / Treatments   Labs (all labs ordered are listed, but only abnormal results are displayed) Labs Reviewed - No data to display  EKG None  Radiology US Venous Img Lower Unilateral Right  Result Date: 07/16/2022 CLINICAL DATA:  Right  lower extremity pain for the past 2 weeks. Evaluate for DVT. EXAM: RIGHT LOWER EXTREMITY VENOUS DOPPLER ULTRASOUND TECHNIQUE: Gray-scale sonography with graded compression, as well as color Doppler and duplex ultrasound were performed to evaluate the lower extremity deep venous systems from the level of the common femoral vein and including the common femoral, femoral, profunda femoral, popliteal and calf veins including the posterior tibial, peroneal and gastrocnemius veins when visible. The superficial great saphenous vein was also interrogated. Spectral Doppler was utilized to evaluate flow at rest and with distal augmentation maneuvers in the common femoral, femoral and popliteal veins. COMPARISON:  None Available. FINDINGS: Contralateral Common Femoral Vein: Respiratory phasicity is normal and symmetric with the symptomatic side. No evidence of thrombus. Normal compressibility. Common Femoral Vein: No evidence of thrombus. Normal compressibility, respiratory phasicity and response to augmentation. Saphenofemoral Junction: No evidence of thrombus. Normal compressibility and flow on color Doppler imaging. Profunda Femoral Vein: No evidence of thrombus. Normal compressibility and flow on color Doppler imaging. Femoral Vein: No evidence of thrombus. Normal compressibility, respiratory phasicity and response to augmentation. Popliteal Vein: No evidence of thrombus. Normal compressibility, respiratory phasicity and response to augmentation. Calf Veins: No evidence of thrombus. Normal compressibility and flow on color Doppler imaging. Superficial Great Saphenous Vein: No evidence of thrombus. Normal compressibility. Other Findings:  None. IMPRESSION: No evidence of DVT within the right lower extremity. Electronically Signed   By: Simonne Come M.D.   On: 07/16/2022 17:12    Procedures Procedures    Medications Ordered in ED Medications - No data to display  ED Course/ Medical Decision Making/ A&P                            Medical Decision Making  36 year old female with numbness in her right thigh for many years.  No weakness or reproduction of pain with ambulation.  Today and over the last couple weeks she has had some numbness and tingling in her right lower leg.  She has no weakness or neurological deficits on exam.  No swelling warmth or redness.  Patient very concerned about a blood clot but she has no risk factors.  Ultrasound was obtained and negative for DVT in right lower extremity.  We discussed possibility of sciatica.  She will follow-up with PCP, orthopedic provider and return to the ER for any worsening symptoms or any urgent changes in health Final Clinical Impression(s) / ED Diagnoses Final diagnoses:  Pain in right leg  Numbness in right leg    Rx / DC Orders ED Discharge Orders          Ordered    predniSONE (DELTASONE) 10 MG tablet  Daily        07/16/22 1725  Evon Slack, PA-C 07/16/22 1727    Minna Antis, MD 07/16/22 2218

## 2022-07-16 NOTE — Discharge Instructions (Signed)
Please follow-up with primary care provider or orthopedic specialist to discussed numbness in the right lower extremity.  Return to the ER for any weakness, increasing pain, worsening symptoms or any urgent changes in your health.  Take prednisone as prescribed.  After completing prednisone you can take Tylenol and/or Aleve

## 2022-07-16 NOTE — ED Triage Notes (Signed)
Pt presents to ED with c/o of bilateral anterior lower legs that started a few days ago. Pt denies any injury or trauma to this area. Pt ambulatory with steady gait to triage. NAD noted.

## 2022-07-16 NOTE — ED Provider Triage Note (Signed)
Emergency Medicine Provider Triage Evaluation Note  Stacey Griffin , a 36 y.o. female  was evaluated in triage.  Pt complains of bilateral leg pain with worsening pain in the right leg.  States started in the left leg but then that got better and outs in the right leg.  States tender to touch.  Patient is on Ozempic.  She is not taking any diuretics.  No blood pressure or diabetes medicines.  Review of Systems  Positive:  Negative:   Physical Exam  Pulse 92   Resp 17  Gen:   Awake, no distress   Resp:  Normal effort  MSK:   Moves extremities without difficulty, tender along the distal calf muscle and anteriorly Other:    Medical Decision Making  Medically screening exam initiated at 4:15 PM.  Appropriate orders placed.  Stacey Griffin was informed that the remainder of the evaluation will be completed by another provider, this initial triage assessment does not replace that evaluation, and the importance of remaining in the ED until their evaluation is complete.  Ultrasound for DVT of the right lower extremity, left lower extremity is not tender so will not order ultrasound   Versie Starks, PA-C 07/16/22 1616

## 2022-09-24 ENCOUNTER — Ambulatory Visit
Admission: EM | Admit: 2022-09-24 | Discharge: 2022-09-24 | Disposition: A | Discharge status: Home / Self Care | Payer: 59 | Attending: Urgent Care | Admitting: Urgent Care

## 2022-09-24 DIAGNOSIS — Z1152 Encounter for screening for COVID-19: Secondary | ICD-10-CM | POA: Diagnosis not present

## 2022-09-24 DIAGNOSIS — J029 Acute pharyngitis, unspecified: Secondary | ICD-10-CM | POA: Diagnosis present

## 2022-09-24 LAB — POCT RAPID STREP A (OFFICE): Rapid Strep A Screen: NEGATIVE

## 2022-09-24 NOTE — ED Triage Notes (Signed)
Pt. Presents to UC w/ c/o a sore throat, nasal drainage and nausea that started today.

## 2022-09-24 NOTE — ED Provider Notes (Signed)
Stacey Griffin    CSN: [id number] Arrival date & time: 09/24/22  1817      History   Chief Complaint Chief Complaint  Patient presents with   Sore Throat    Entered by patient    HPI Stacey Griffin is a 37 y.o. female.    Sore Throat    Presents urgent care with complaint of sore throat, nasal drainage, nausea starting today.  Past Medical History:  Diagnosis Date   Hypertension    Migraines     There are no problems to display for this patient.   No past surgical history on file.  OB History   No obstetric history on file.      Home Medications    Prior to Admission medications   Medication Sig Start Date End Date Taking? Authorizing Provider  acetaminophen (TYLENOL) 325 MG tablet Take 650 mg by mouth as needed for mild pain.    [provider]  amLODipine (NORVASC) 2.5 MG tablet Take 2.5 mg by mouth daily.    [provider]  amoxicillin (AMOXIL) 500 MG tablet Take 1 tablet (500 mg total) by mouth 3 (three) times daily. 09/12/19   Sharion Balloon, NP  cephALEXin (KEFLEX) 500 MG capsule Take 1 capsule (500 mg total) by mouth 4 (four) times daily. 01/24/16   Fredia Sorrow, MD  estradiol cypionate (DEPO-ESTRADIOL) 5 MG/ML injection Inject into the muscle every 3 (three) months.    [provider]  HYDROcodone-acetaminophen (NORCO/VICODIN) 5-325 MG tablet Take 1 tablet by mouth every 4 (four) hours as needed. 01/22/16   Lily Kocher, PA-C  HYDROcodone-acetaminophen (NORCO/VICODIN) 5-325 MG tablet Take 1-2 tablets by mouth every 6 (six) hours as needed. 01/24/16   Fredia Sorrow, MD  ondansetron (ZOFRAN) 4 MG tablet Take 1 tablet (4 mg total) by mouth every 6 (six) hours. 01/22/16   Lily Kocher, PA-C  predniSONE (DELTASONE) 10 MG tablet Take 1 tablet (10 mg total) by mouth daily. 6,5,4,3,2,1 six day taper 07/16/22   Duanne Guess, PA-C  promethazine (PHENERGAN) 25 MG tablet Take 1 tablet (25 mg total) by mouth every 6 (six)  hours as needed. 01/24/16   Fredia Sorrow, MD    Family History No family history on file.  Social History Social History   Tobacco Use   Smoking status: Never   Smokeless tobacco: Never  Substance Use Topics   Alcohol use: No   Drug use: No     Allergies   Patient has no known allergies.   Review of Systems Review of Systems   Physical Exam Triage Vital Signs ED Triage Vitals  Enc Vitals Group     BP      Pulse      Resp      Temp      Temp src      SpO2      Weight      Height      Head Circumference      Peak Flow      Pain Score      Pain Loc      Pain Edu?      Excl. in [place]?    No data found.  Updated Vital Signs There were no vitals taken for this visit.  Visual Acuity Right Eye Distance:   Left Eye Distance:   Bilateral Distance:    Right Eye Near:   Left Eye Near:    Bilateral Near:     Physical Exam Vitals  reviewed.  Constitutional:      Appearance: She is well-developed.  HENT:     Mouth/Throat:     Mouth: Mucous membranes are moist.     Pharynx: Posterior oropharyngeal erythema present. No oropharyngeal exudate.  Skin:    General: Skin is warm and dry.  Neurological:     General: No focal deficit present.     Mental Status: She is alert and oriented to person, place, and time.  Psychiatric:        Mood and Affect: Mood normal.        Behavior: Behavior normal.      UC Treatments / Results  Labs (all labs ordered are listed, but only abnormal results are displayed) Labs Reviewed  POCT RAPID STREP A (OFFICE)    EKG   Radiology No results found.  Procedures Procedures (including critical care time)  Medications Ordered in UC Medications - No data to display  Initial Impression / Assessment and Plan / UC Course  I have reviewed the triage vital signs and the nursing notes.  Pertinent labs & imaging results that were available during my care of the patient were reviewed by me and considered in my medical  decision making (see chart for details).   Patient is afebrile here without recent antipyretics. Satting well on room air. Overall is well appearing, well hydrated, without respiratory distress.  Mild oropharyngeal erythema.  No peritonsillar exudates.  Symptoms are consistent with an acute viral process.  Recommending continued use of OTC medication for symptom control.    Final Clinical Impressions(s) / UC Diagnoses   Final diagnoses:  None   Discharge Instructions   None    ED Prescriptions   None    PDMP not reviewed this encounter.   Rose Phi, [place] 09/24/22 [phone]

## 2022-09-24 NOTE — Discharge Instructions (Addendum)
You have been diagnosed with a viral upper respiratory infection based on your symptoms and exam. Viral illnesses cannot be treated with antibiotics - they are self limiting - and you should find your symptoms resolving within a few days. Get plenty of rest and non-caffeinated fluids. Watch for signs of dehydration including reduced urine output and dark colored urine.  We have performed a respiratory swab testing for COVID.   We recommend you use over-the-counter medications for symptom control including acetaminophen (Tylenol), ibuprofen (Advil/Motrin) or naproxen (Aleve) for throat pain, fever, chills or body aches. You may combine use of acetaminophen and ibuprofen/naproxen if needed.  Some patients find an pain-relieving throat spray such as Chloraseptic to be effective.  Also recommend cold/cough medication containing a cough suppressant such as dextromethorphan, as needed. Please note that some cough medications are not recommended if you suffer from hypertension.    Saline mist spray is helpful for removing excess mucus from your nose.  Room humidifiers are helpful to ease breathing at night. I recommend guaifenesin (Mucinex) with plenty of water throughout the day to help thin and loosen mucus secretions in your respiratory passages.   If appropriate based upon your other medical problems, you might also find relief of nasal/sinus congestion symptoms by using a nasal decongestant such as fluticasone (Flonase ) or pseudoephedrine (Sudafed sinus).  You will need to obtain Sudafed from behind the pharmacist counter.  Speak to the pharmacist to verify that you are not duplicating medications with other over-the-counter formulations that you may be using.      

## 2022-09-25 LAB — SARS CORONAVIRUS 2 (TAT 6-24 HRS): SARS Coronavirus 2: NEGATIVE

## 2023-03-18 ENCOUNTER — Emergency Department
Admission: EM | Admit: 2023-03-18 | Discharge: 2023-03-18 | Disposition: A | Discharge status: Home / Self Care | Payer: 59 | Attending: Emergency Medicine | Admitting: Emergency Medicine

## 2023-03-18 ENCOUNTER — Other Ambulatory Visit: Payer: Self-pay

## 2023-03-18 DIAGNOSIS — R040 Epistaxis: Secondary | ICD-10-CM | POA: Insufficient documentation

## 2023-03-18 DIAGNOSIS — I10 Essential (primary) hypertension: Secondary | ICD-10-CM | POA: Diagnosis not present

## 2023-03-18 MED ORDER — AMLODIPINE BESYLATE 5 MG PO TABS
10.0000 mg | ORAL_TABLET | Freq: Once | ORAL | Status: AC
  Administered 2023-03-18: 10 mg via ORAL
  Filled 2023-03-18: qty 2

## 2023-03-18 NOTE — ED Provider Notes (Signed)
[facility] Provider Note    Event Date/Time   First MD Initiated Contact with Patient 03/18/23 1635     (approximate)   History   Epistaxis   HPI  Stacey Griffin is a 37 y.o. female history of hypertension  Was at work today suddenly felt like her nose was running when she realized it was blood.  She never had a nosebleed before.  She also reports that she had not taken her blood pressure medicine today which she does have but did not take.  No headache no recent illnesses.  She reports it ran down the back of her throat causing her to vomit.  It seems to have stopped now after applying clamp on arrival to the ER.       Physical Exam   Triage Vital Signs: ED Triage Vitals  Enc Vitals Group     BP --      Pulse Rate 03/18/23 1642 72     Resp 03/18/23 1642 19     Temp 03/18/23 1642 98.9 F (37.2 C)     Temp Source 03/18/23 1642 Oral     SpO2 03/18/23 1642 96 %     Weight 03/18/23 1633 (!) 350 lb 15.6 oz (159.2 kg)     Height 03/18/23 1633 5\' 2"  (1.575 m)     Head Circumference --      Peak Flow --      Pain Score 03/18/23 1633 0     Pain Loc --      Pain Edu? --      Excl. in GC? --     Most recent vital signs: Vitals:   03/18/23 1642 03/18/23 1751  BP:  (!) 155/92  Pulse: 72   Resp: 19   Temp: 98.9 F (37.2 C)   SpO2: 96%      General: Awake, no distress.  Normocephalic atraumatic Left nare normal.  Right nare with slight amount of dried blood and on the medial septum with a small area of adherent clot anteriorly over Hesselbach's plexus.  The posterior turbinates do not appear to be bleeding.  There is no bleeding in the posterior oropharynx.  And remove clamp and there is no active epistaxis.  Observe the patient for approximately 20 minutes or other with no further evidence of bleeding or epistaxis. CV:  Good peripheral perfusion.  Resp:  Normal effort.  Abd:  No distention.  Other:     ED Results / Procedures /  Treatments   Labs (all labs ordered are listed, but only abnormal results are displayed) Labs Reviewed - No data to display   EKG     RADIOLOGY     PROCEDURES:  Critical Care performed: No  Procedures   MEDICATIONS ORDERED IN ED: Medications  amLODipine (NORVASC) tablet 10 mg (10 mg Oral Given 03/18/23 1711)     IMPRESSION / MDM / ASSESSMENT AND PLAN / ED COURSE  I reviewed the triage vital signs and the nursing notes.                              Differential diagnosis includes, but is not limited to, spontaneous epistaxis, uncontrolled hypertension, minor nasal trauma, etc.  No evidence of ongoing bleeding or severe posterior epistaxis.  Appears consistent with bleeding from Hesselbach's plexus.  Bleeding alleviated with clamping for about 15 minutes.  No further bleeding on observation.  Gave her her home dose of  amlodipine which she had not taken today, and encouraged her to be compliant with her blood pressure medication moving forward as this may have contributed to her nosebleed.  Patient's presentation is most consistent with acute, uncomplicated illness.      Clinical Course as of 03/18/23 1819  Wed Mar 18, 2023  1700 BP 155/92 [MQ]    Clinical Course User Index [MQ] Sharyn Creamer, MD   Patient comfortable with careful return precautions. Return precautions and treatment recommendations and follow-up discussed with the patient who is agreeable with the plan.   FINAL CLINICAL IMPRESSION(S) / ED DIAGNOSES   Final diagnoses:  Acute anterior epistaxis     Rx / DC Orders   ED Discharge Orders     None        Note:  This document was prepared using Dragon voice recognition software and may include unintentional dictation errors.   Sharyn Creamer, MD 03/18/23 [id number]

## 2023-03-18 NOTE — ED Triage Notes (Signed)
C/O nose bleed today. Onset around 1600.  C/O clots from nose.  Pressure applied. Bleeding controlled currently.

## 2023-03-18 NOTE — ED Notes (Signed)
See triage notes. Patient stated she was talking to her boss when she thought her nose started to run. Relieved it was bleeding when she wiped. Stated was having large clots come out from nose and vomited some clots up as well. Pressure and a clamp have been applied. Patient stated this went on for about 30 minutes.

## 2023-06-30 ENCOUNTER — Other Ambulatory Visit: Payer: Self-pay
